# Patient Record
Sex: Female | Born: 1985 | Race: Black or African American | Hispanic: No | Marital: Single | Smoking: Never smoker
Health system: Southern US, Community
[De-identification: ages and names within clinical notes are randomized; demographics above are authoritative.]

## PROBLEM LIST (undated history)

## (undated) DIAGNOSIS — F32A Depression, unspecified: Secondary | ICD-10-CM

## (undated) DIAGNOSIS — K219 Gastro-esophageal reflux disease without esophagitis: Secondary | ICD-10-CM

## (undated) DIAGNOSIS — F410 Panic disorder [episodic paroxysmal anxiety] without agoraphobia: Secondary | ICD-10-CM

## (undated) DIAGNOSIS — R002 Palpitations: Secondary | ICD-10-CM

## (undated) DIAGNOSIS — R519 Headache, unspecified: Secondary | ICD-10-CM

## (undated) DIAGNOSIS — N83209 Unspecified ovarian cyst, unspecified side: Secondary | ICD-10-CM

## (undated) DIAGNOSIS — E079 Disorder of thyroid, unspecified: Secondary | ICD-10-CM

## (undated) DIAGNOSIS — E0590 Thyrotoxicosis, unspecified without thyrotoxic crisis or storm: Secondary | ICD-10-CM

## (undated) DIAGNOSIS — Z8719 Personal history of other diseases of the digestive system: Secondary | ICD-10-CM

## (undated) DIAGNOSIS — T8859XA Other complications of anesthesia, initial encounter: Secondary | ICD-10-CM

## (undated) HISTORY — PX: ESOPHAGOGASTRODUODENOSCOPY ENDOSCOPY: SHX5814

## (undated) HISTORY — PX: WISDOM TOOTH EXTRACTION: SHX21

## (undated) HISTORY — PX: TUBAL LIGATION: SHX77

---
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
## 2003-03-02 ENCOUNTER — Encounter: Admission: RE | Admit: 2003-03-02 | Discharge: 2003-03-02 | Payer: Self-pay | Admitting: Family Medicine

## 2003-03-23 ENCOUNTER — Other Ambulatory Visit: Admission: RE | Admit: 2003-03-23 | Discharge: 2003-03-23 | Payer: Self-pay | Admitting: Obstetrics and Gynecology

## 2003-09-13 ENCOUNTER — Other Ambulatory Visit: Admission: RE | Admit: 2003-09-13 | Discharge: 2003-09-13 | Payer: Self-pay | Admitting: Obstetrics and Gynecology

## 2004-01-31 ENCOUNTER — Other Ambulatory Visit: Admission: RE | Admit: 2004-01-31 | Discharge: 2004-01-31 | Payer: Self-pay | Admitting: Obstetrics and Gynecology

## 2004-03-13 ENCOUNTER — Emergency Department (HOSPITAL_COMMUNITY): Admission: EM | Admit: 2004-03-13 | Discharge: 2004-03-13 | Payer: Self-pay | Admitting: Emergency Medicine

## 2004-07-01 ENCOUNTER — Emergency Department (HOSPITAL_COMMUNITY): Admission: EM | Admit: 2004-07-01 | Discharge: 2004-07-01 | Payer: Self-pay | Admitting: Emergency Medicine

## 2004-08-03 ENCOUNTER — Ambulatory Visit (HOSPITAL_COMMUNITY): Admission: RE | Admit: 2004-08-03 | Discharge: 2004-08-03 | Payer: Self-pay | Admitting: Obstetrics and Gynecology

## 2004-08-04 ENCOUNTER — Inpatient Hospital Stay (HOSPITAL_COMMUNITY): Admission: AD | Admit: 2004-08-04 | Discharge: 2004-08-04 | Payer: Self-pay | Admitting: Obstetrics and Gynecology

## 2004-08-14 ENCOUNTER — Ambulatory Visit (HOSPITAL_COMMUNITY): Admission: RE | Admit: 2004-08-14 | Discharge: 2004-08-14 | Payer: Self-pay | Admitting: Obstetrics and Gynecology

## 2004-09-04 ENCOUNTER — Other Ambulatory Visit: Admission: RE | Admit: 2004-09-04 | Discharge: 2004-09-04 | Payer: Self-pay | Admitting: Obstetrics and Gynecology

## 2004-09-08 ENCOUNTER — Inpatient Hospital Stay (HOSPITAL_COMMUNITY): Admission: AD | Admit: 2004-09-08 | Discharge: 2004-09-09 | Payer: Self-pay | Admitting: Obstetrics and Gynecology

## 2004-09-12 ENCOUNTER — Inpatient Hospital Stay (HOSPITAL_COMMUNITY): Admission: AD | Admit: 2004-09-12 | Discharge: 2004-09-12 | Payer: Self-pay | Admitting: Obstetrics and Gynecology

## 2004-10-30 ENCOUNTER — Inpatient Hospital Stay (HOSPITAL_COMMUNITY): Admission: AD | Admit: 2004-10-30 | Discharge: 2004-10-30 | Payer: Self-pay | Admitting: Obstetrics and Gynecology

## 2004-11-10 ENCOUNTER — Inpatient Hospital Stay (HOSPITAL_COMMUNITY): Admission: AD | Admit: 2004-11-10 | Discharge: 2004-11-10 | Payer: Self-pay | Admitting: Obstetrics and Gynecology

## 2004-11-17 ENCOUNTER — Inpatient Hospital Stay (HOSPITAL_COMMUNITY): Admission: AD | Admit: 2004-11-17 | Discharge: 2004-11-17 | Payer: Self-pay | Admitting: Obstetrics and Gynecology

## 2004-12-15 ENCOUNTER — Inpatient Hospital Stay (HOSPITAL_COMMUNITY): Admission: AD | Admit: 2004-12-15 | Discharge: 2004-12-15 | Payer: Self-pay | Admitting: Obstetrics and Gynecology

## 2005-01-07 ENCOUNTER — Inpatient Hospital Stay (HOSPITAL_COMMUNITY): Admission: AD | Admit: 2005-01-07 | Discharge: 2005-01-07 | Payer: Self-pay | Admitting: Obstetrics and Gynecology

## 2005-01-24 ENCOUNTER — Inpatient Hospital Stay (HOSPITAL_COMMUNITY): Admission: AD | Admit: 2005-01-24 | Discharge: 2005-01-24 | Payer: Self-pay | Admitting: Obstetrics and Gynecology

## 2005-02-13 ENCOUNTER — Inpatient Hospital Stay (HOSPITAL_COMMUNITY): Admission: AD | Admit: 2005-02-13 | Discharge: 2005-02-13 | Payer: Self-pay | Admitting: Obstetrics and Gynecology

## 2005-03-22 ENCOUNTER — Inpatient Hospital Stay (HOSPITAL_COMMUNITY): Admission: AD | Admit: 2005-03-22 | Discharge: 2005-03-25 | Payer: Self-pay | Admitting: Obstetrics and Gynecology

## 2005-03-22 ENCOUNTER — Encounter (INDEPENDENT_AMBULATORY_CARE_PROVIDER_SITE_OTHER): Payer: Self-pay | Admitting: Specialist

## 2005-03-25 ENCOUNTER — Inpatient Hospital Stay (HOSPITAL_COMMUNITY): Admission: AD | Admit: 2005-03-25 | Discharge: 2005-03-26 | Payer: Self-pay | Admitting: Family Medicine

## 2005-04-11 ENCOUNTER — Inpatient Hospital Stay (HOSPITAL_COMMUNITY): Admission: AD | Admit: 2005-04-11 | Discharge: 2005-04-11 | Payer: Self-pay | Admitting: Obstetrics and Gynecology

## 2005-06-15 ENCOUNTER — Emergency Department (HOSPITAL_COMMUNITY): Admission: EM | Admit: 2005-06-15 | Discharge: 2005-06-15 | Payer: Self-pay | Admitting: Emergency Medicine

## 2005-06-16 ENCOUNTER — Emergency Department (HOSPITAL_COMMUNITY): Admission: EM | Admit: 2005-06-16 | Discharge: 2005-06-16 | Payer: Self-pay | Admitting: *Deleted

## 2005-07-16 ENCOUNTER — Emergency Department (HOSPITAL_COMMUNITY): Admission: EM | Admit: 2005-07-16 | Discharge: 2005-07-16 | Payer: Self-pay | Admitting: Emergency Medicine

## 2005-07-17 ENCOUNTER — Emergency Department (HOSPITAL_COMMUNITY): Admission: EM | Admit: 2005-07-17 | Discharge: 2005-07-17 | Payer: Self-pay | Admitting: Emergency Medicine

## 2005-08-08 ENCOUNTER — Other Ambulatory Visit: Admission: RE | Admit: 2005-08-08 | Discharge: 2005-08-08 | Payer: Self-pay | Admitting: Family Medicine

## 2005-10-22 ENCOUNTER — Emergency Department (HOSPITAL_COMMUNITY): Admission: EM | Admit: 2005-10-22 | Discharge: 2005-10-22 | Payer: Self-pay | Admitting: Emergency Medicine

## 2005-10-23 ENCOUNTER — Encounter: Admission: RE | Admit: 2005-10-23 | Discharge: 2005-10-23 | Payer: Self-pay | Admitting: Family Medicine

## 2005-11-03 ENCOUNTER — Ambulatory Visit (HOSPITAL_COMMUNITY): Admission: RE | Admit: 2005-11-03 | Discharge: 2005-11-03 | Payer: Self-pay | Admitting: Family Medicine

## 2006-01-03 ENCOUNTER — Emergency Department (HOSPITAL_COMMUNITY): Admission: EM | Admit: 2006-01-03 | Discharge: 2006-01-03 | Payer: Self-pay | Admitting: Emergency Medicine

## 2006-03-07 ENCOUNTER — Emergency Department (HOSPITAL_COMMUNITY): Admission: EM | Admit: 2006-03-07 | Discharge: 2006-03-07 | Payer: Self-pay | Admitting: Family Medicine

## 2006-03-17 ENCOUNTER — Encounter: Admission: RE | Admit: 2006-03-17 | Discharge: 2006-03-17 | Payer: Self-pay | Admitting: Surgery

## 2006-04-21 ENCOUNTER — Encounter: Admission: RE | Admit: 2006-04-21 | Discharge: 2006-04-21 | Payer: Self-pay | Admitting: Family Medicine

## 2006-05-19 ENCOUNTER — Emergency Department (HOSPITAL_COMMUNITY): Admission: EM | Admit: 2006-05-19 | Discharge: 2006-05-19 | Payer: Self-pay | Admitting: Family Medicine

## 2006-06-08 ENCOUNTER — Emergency Department (HOSPITAL_COMMUNITY): Admission: EM | Admit: 2006-06-08 | Discharge: 2006-06-08 | Payer: Self-pay | Admitting: Family Medicine

## 2006-09-16 ENCOUNTER — Emergency Department (HOSPITAL_COMMUNITY): Admission: EM | Admit: 2006-09-16 | Discharge: 2006-09-16 | Payer: Self-pay | Admitting: Emergency Medicine

## 2006-10-28 ENCOUNTER — Emergency Department (HOSPITAL_COMMUNITY): Admission: EM | Admit: 2006-10-28 | Discharge: 2006-10-28 | Payer: Self-pay | Admitting: Emergency Medicine

## 2006-10-31 ENCOUNTER — Emergency Department (HOSPITAL_COMMUNITY): Admission: EM | Admit: 2006-10-31 | Discharge: 2006-10-31 | Payer: Self-pay | Admitting: Emergency Medicine

## 2006-11-19 ENCOUNTER — Emergency Department (HOSPITAL_COMMUNITY): Admission: EM | Admit: 2006-11-19 | Discharge: 2006-11-19 | Payer: Self-pay | Admitting: Family Medicine

## 2007-03-31 ENCOUNTER — Encounter: Payer: Self-pay | Admitting: Internal Medicine

## 2007-05-07 ENCOUNTER — Ambulatory Visit: Payer: Self-pay | Admitting: Internal Medicine

## 2007-05-07 DIAGNOSIS — R634 Abnormal weight loss: Secondary | ICD-10-CM | POA: Insufficient documentation

## 2007-07-20 ENCOUNTER — Encounter: Payer: Self-pay | Admitting: Family Medicine

## 2007-08-06 ENCOUNTER — Emergency Department (HOSPITAL_COMMUNITY): Admission: EM | Admit: 2007-08-06 | Discharge: 2007-08-06 | Payer: Self-pay | Admitting: Emergency Medicine

## 2007-08-14 ENCOUNTER — Inpatient Hospital Stay (HOSPITAL_COMMUNITY): Admission: AD | Admit: 2007-08-14 | Discharge: 2007-08-14 | Payer: Self-pay | Admitting: Obstetrics

## 2007-08-21 ENCOUNTER — Inpatient Hospital Stay (HOSPITAL_COMMUNITY): Admission: AD | Admit: 2007-08-21 | Discharge: 2007-08-21 | Payer: Self-pay | Admitting: Obstetrics & Gynecology

## 2007-09-05 ENCOUNTER — Emergency Department (HOSPITAL_COMMUNITY): Admission: EM | Admit: 2007-09-05 | Discharge: 2007-09-05 | Payer: Self-pay | Admitting: Emergency Medicine

## 2007-11-09 ENCOUNTER — Ambulatory Visit (HOSPITAL_COMMUNITY): Admission: RE | Admit: 2007-11-09 | Discharge: 2007-11-09 | Payer: Self-pay | Admitting: Obstetrics

## 2007-11-30 ENCOUNTER — Inpatient Hospital Stay (HOSPITAL_COMMUNITY): Admission: AD | Admit: 2007-11-30 | Discharge: 2007-11-30 | Payer: Self-pay | Admitting: Obstetrics

## 2008-02-03 ENCOUNTER — Ambulatory Visit (HOSPITAL_COMMUNITY): Admission: RE | Admit: 2008-02-03 | Discharge: 2008-02-03 | Payer: Self-pay | Admitting: Obstetrics

## 2008-02-15 ENCOUNTER — Inpatient Hospital Stay (HOSPITAL_COMMUNITY): Admission: AD | Admit: 2008-02-15 | Discharge: 2008-02-15 | Payer: Self-pay | Admitting: Obstetrics

## 2008-03-09 ENCOUNTER — Inpatient Hospital Stay (HOSPITAL_COMMUNITY): Admission: AD | Admit: 2008-03-09 | Discharge: 2008-03-09 | Payer: Self-pay | Admitting: Obstetrics & Gynecology

## 2008-03-24 ENCOUNTER — Ambulatory Visit (HOSPITAL_COMMUNITY): Admission: RE | Admit: 2008-03-24 | Discharge: 2008-03-24 | Payer: Self-pay | Admitting: Obstetrics

## 2008-04-10 ENCOUNTER — Inpatient Hospital Stay (HOSPITAL_COMMUNITY): Admission: AD | Admit: 2008-04-10 | Discharge: 2008-04-10 | Payer: Self-pay | Admitting: Obstetrics & Gynecology

## 2008-04-12 ENCOUNTER — Encounter: Payer: Self-pay | Admitting: Obstetrics

## 2008-04-12 ENCOUNTER — Inpatient Hospital Stay (HOSPITAL_COMMUNITY): Admission: RE | Admit: 2008-04-12 | Discharge: 2008-04-15 | Payer: Self-pay | Admitting: Obstetrics

## 2008-04-23 ENCOUNTER — Inpatient Hospital Stay (HOSPITAL_COMMUNITY): Admission: AD | Admit: 2008-04-23 | Discharge: 2008-04-23 | Payer: Self-pay | Admitting: Obstetrics & Gynecology

## 2008-12-23 ENCOUNTER — Emergency Department (HOSPITAL_COMMUNITY): Admission: EM | Admit: 2008-12-23 | Discharge: 2008-12-23 | Payer: Self-pay | Admitting: Emergency Medicine

## 2009-02-03 ENCOUNTER — Encounter: Payer: Self-pay | Admitting: Internal Medicine

## 2009-02-04 ENCOUNTER — Emergency Department (HOSPITAL_COMMUNITY): Admission: EM | Admit: 2009-02-04 | Discharge: 2009-02-04 | Payer: Self-pay | Admitting: Family Medicine

## 2009-03-10 ENCOUNTER — Emergency Department (HOSPITAL_COMMUNITY): Admission: EM | Admit: 2009-03-10 | Discharge: 2009-03-11 | Payer: Self-pay | Admitting: Emergency Medicine

## 2009-03-29 ENCOUNTER — Encounter: Payer: Self-pay | Admitting: Internal Medicine

## 2009-03-30 ENCOUNTER — Encounter: Payer: Self-pay | Admitting: Internal Medicine

## 2009-04-24 ENCOUNTER — Emergency Department (HOSPITAL_COMMUNITY): Admission: EM | Admit: 2009-04-24 | Discharge: 2009-04-24 | Payer: Self-pay | Admitting: Emergency Medicine

## 2009-04-28 ENCOUNTER — Ambulatory Visit: Payer: Self-pay | Admitting: Internal Medicine

## 2009-04-28 DIAGNOSIS — F411 Generalized anxiety disorder: Secondary | ICD-10-CM | POA: Insufficient documentation

## 2009-04-28 DIAGNOSIS — E0590 Thyrotoxicosis, unspecified without thyrotoxic crisis or storm: Secondary | ICD-10-CM | POA: Insufficient documentation

## 2009-05-15 IMAGING — US US OB FOLLOW-UP
1 series · 14 of 23 positions shown · non-contrast
Comparison: none

OBSTETRICAL ULTRASOUND:
 This ultrasound exam was performed in the [HOSPITAL] Ultrasound Department.  The OB US report was generated in the AS system, and faxed to the ordering physician.  This report is also available in [REDACTED] PACS.

[Series 1: us ob follow-up · 0.28mm/px · 14 of 23 slices shown]
[im 1/23]
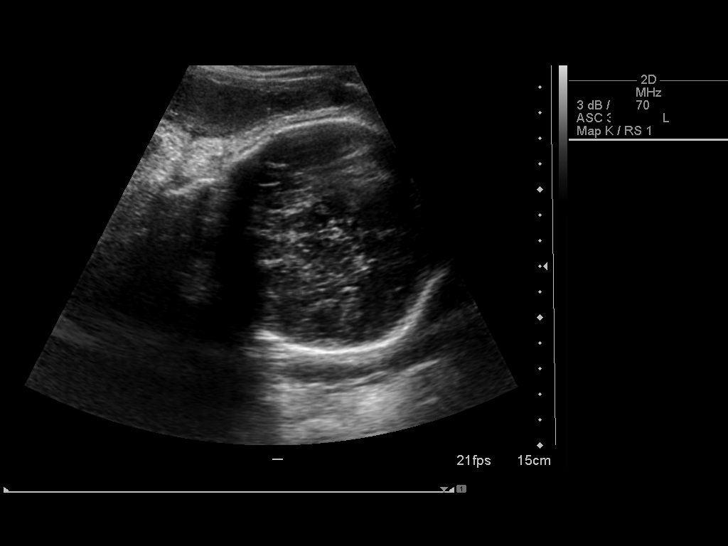
[im 3/23]
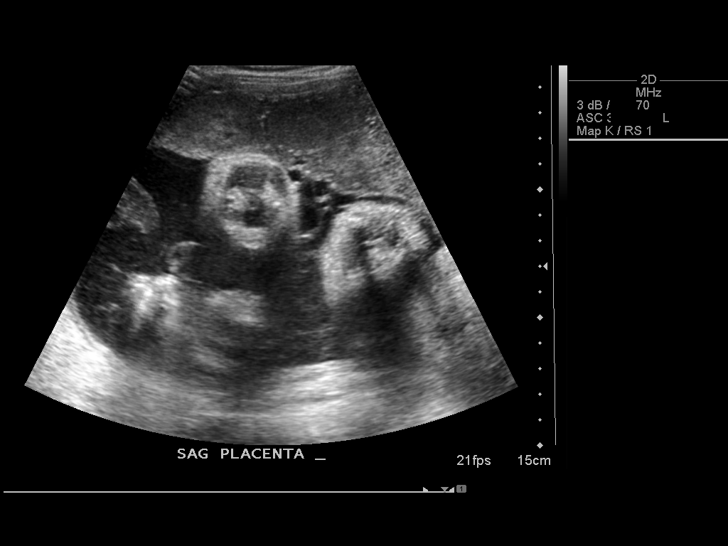
[im 5/23]
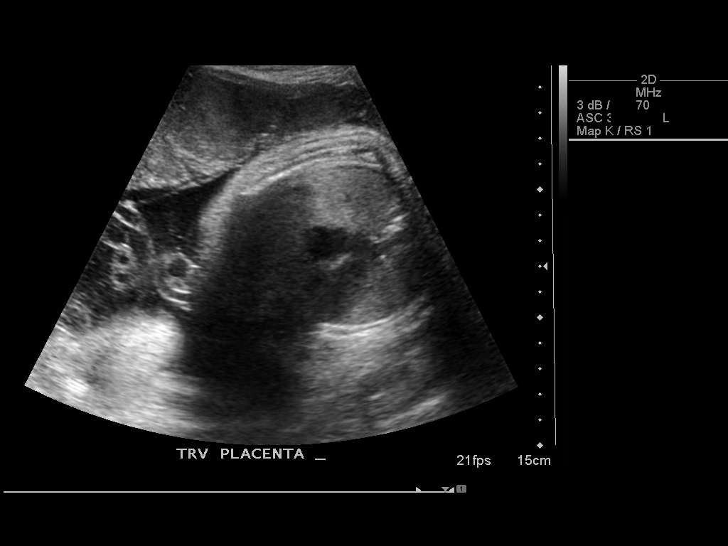
[im 6/23]
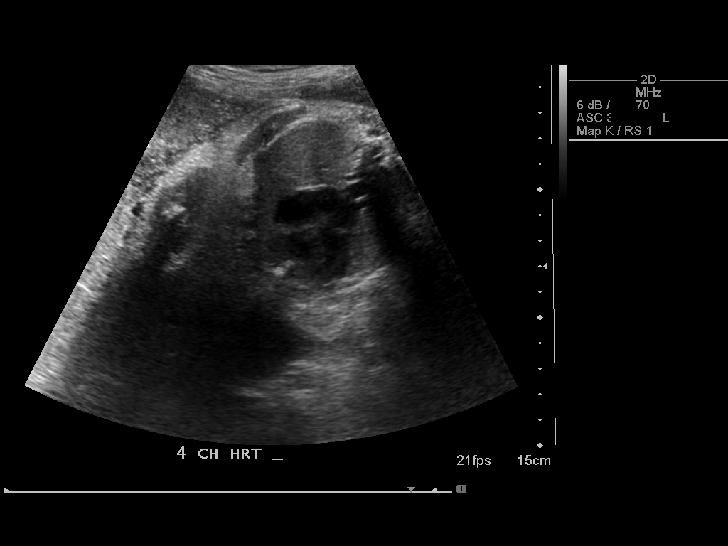
[im 8/23]
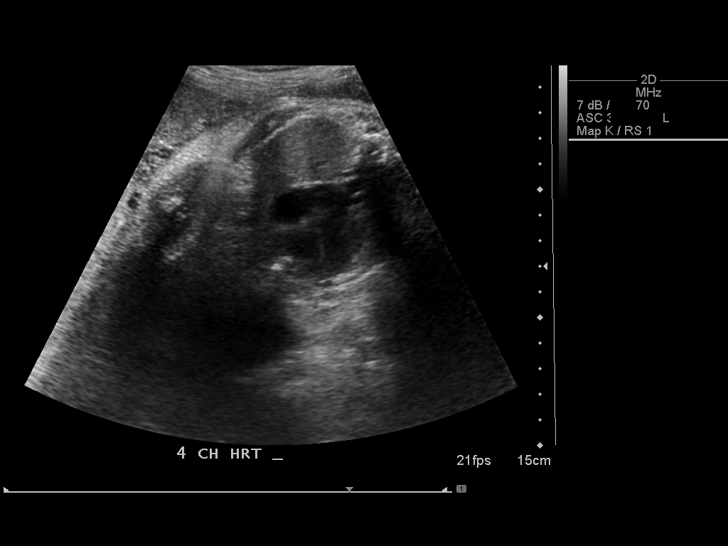
[im 10/23]
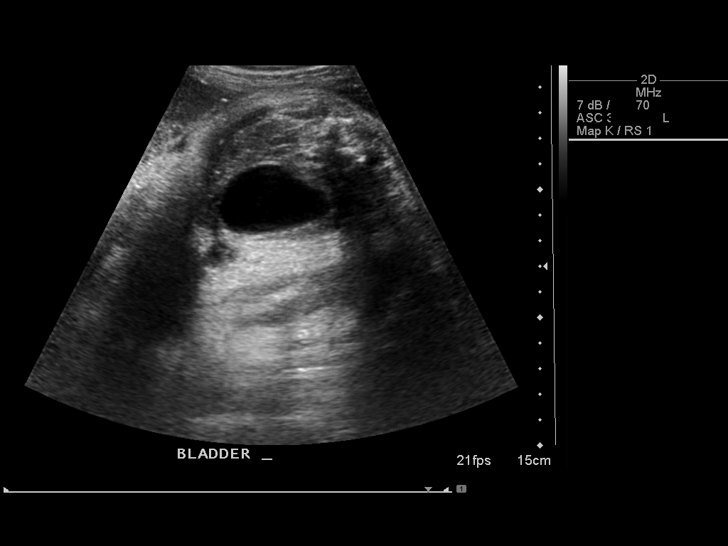
[im 11/23]
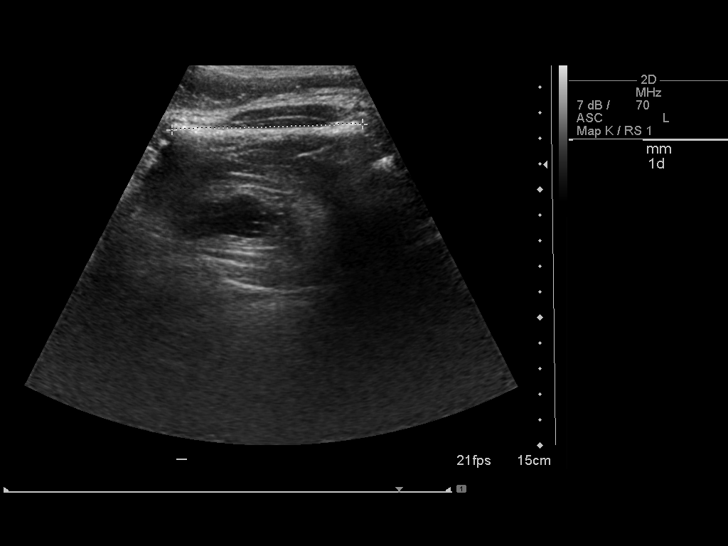
[im 13/23]
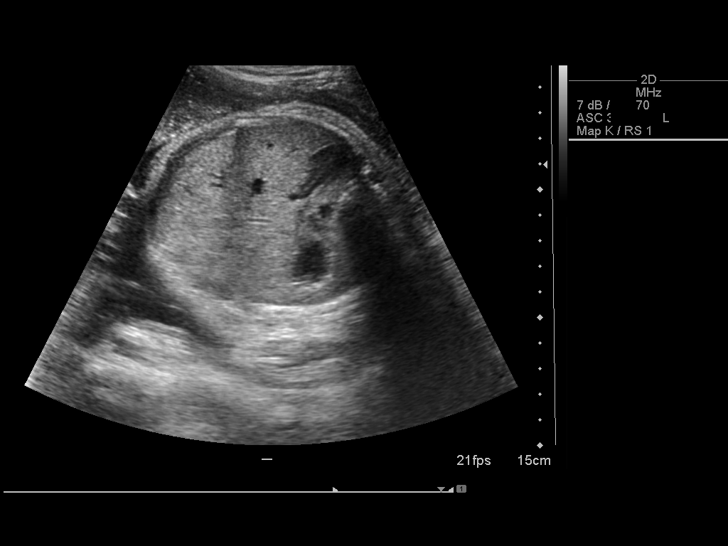
[im 14/23]
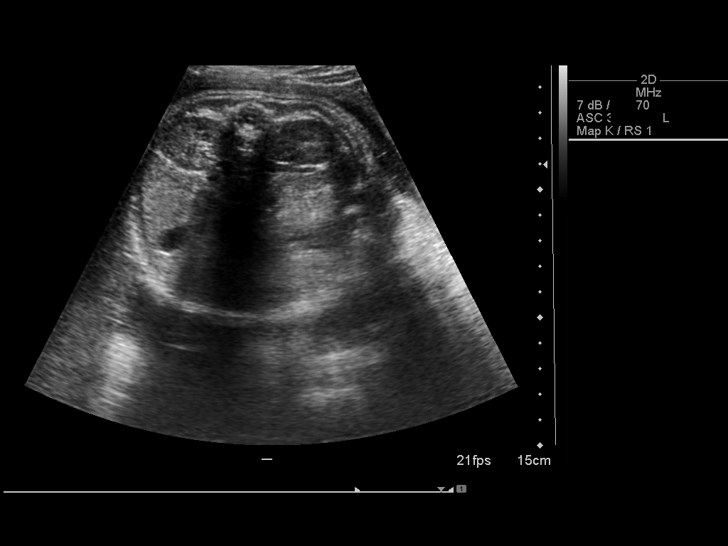
[im 16/23]
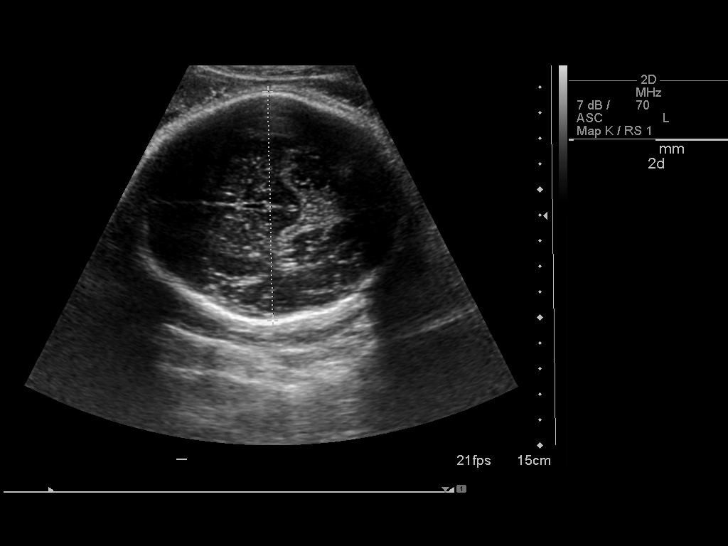
[im 18/23]
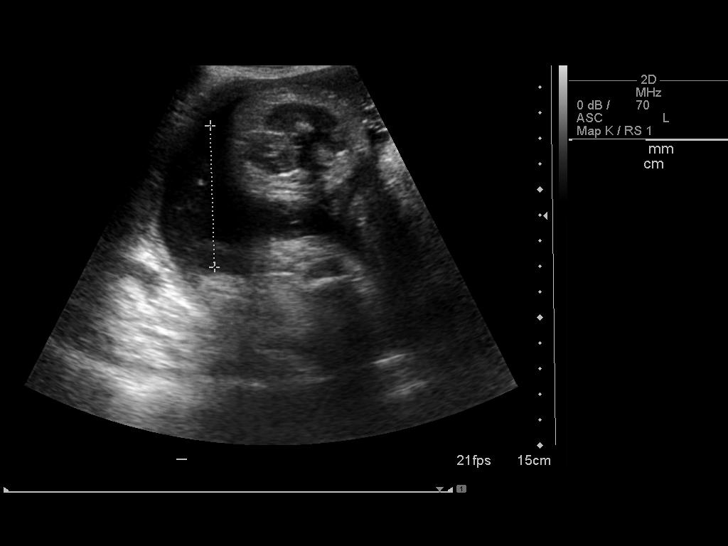
[im 19/23]
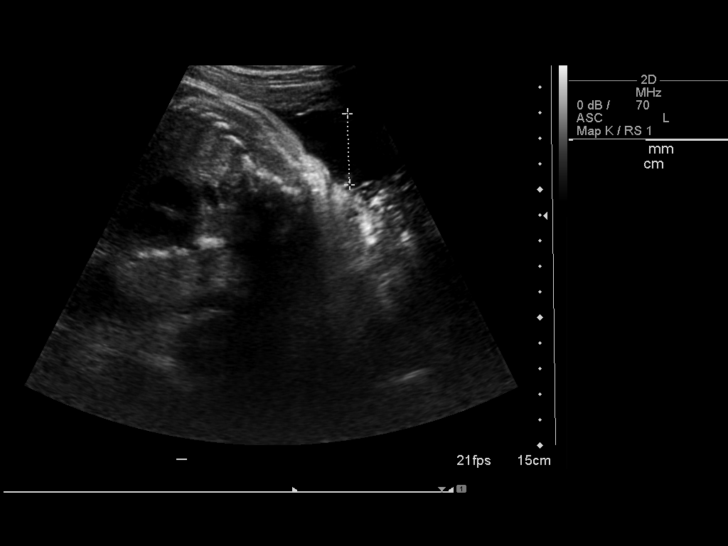
[im 21/23]
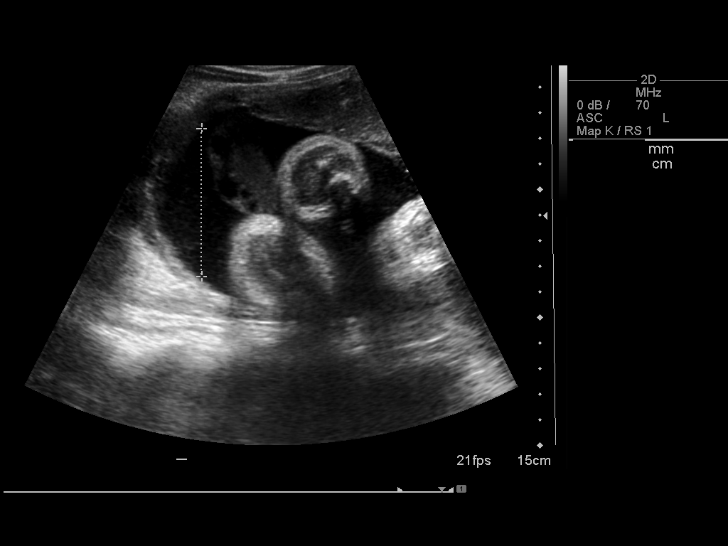
[im 23/23]
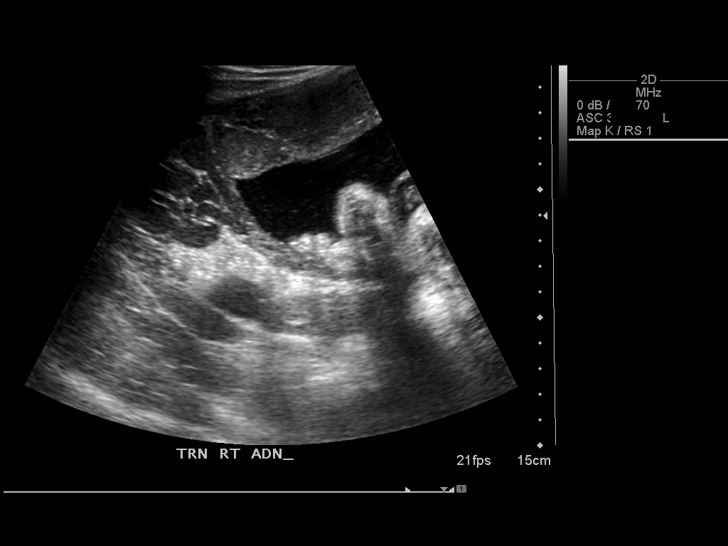

[14 of 23 positions shown; findings below may reference images not displayed]

IMPRESSION: See AS Obstetric US report.

## 2009-06-09 ENCOUNTER — Emergency Department (HOSPITAL_COMMUNITY): Admission: EM | Admit: 2009-06-09 | Discharge: 2009-06-09 | Payer: Self-pay | Admitting: Emergency Medicine

## 2009-06-14 IMAGING — CR DG CHEST 2V
2 series · 2 of 2 positions shown · non-contrast
Comparison: None

CLINICAL DATA: Hypertension and chest discomfort

CHEST - 2 VIEW

[view not recorded (1 of 2)]
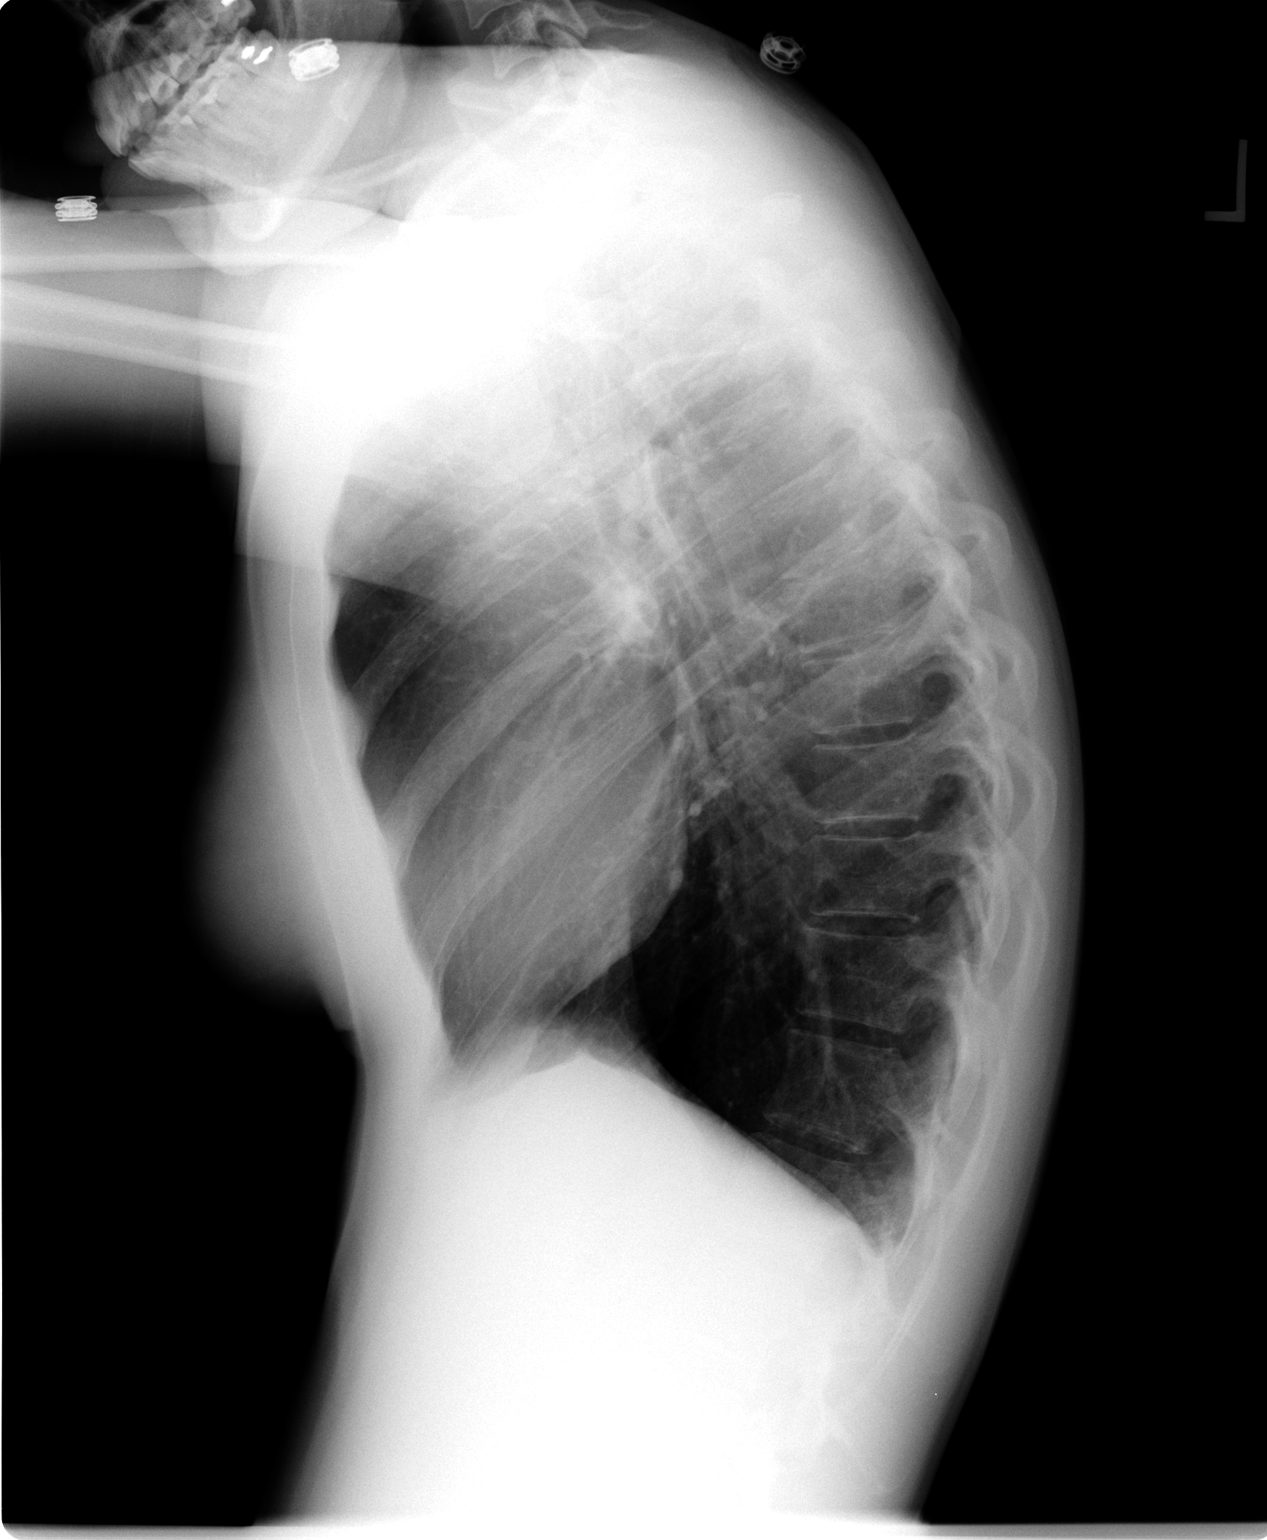

[view not recorded (2 of 2)]
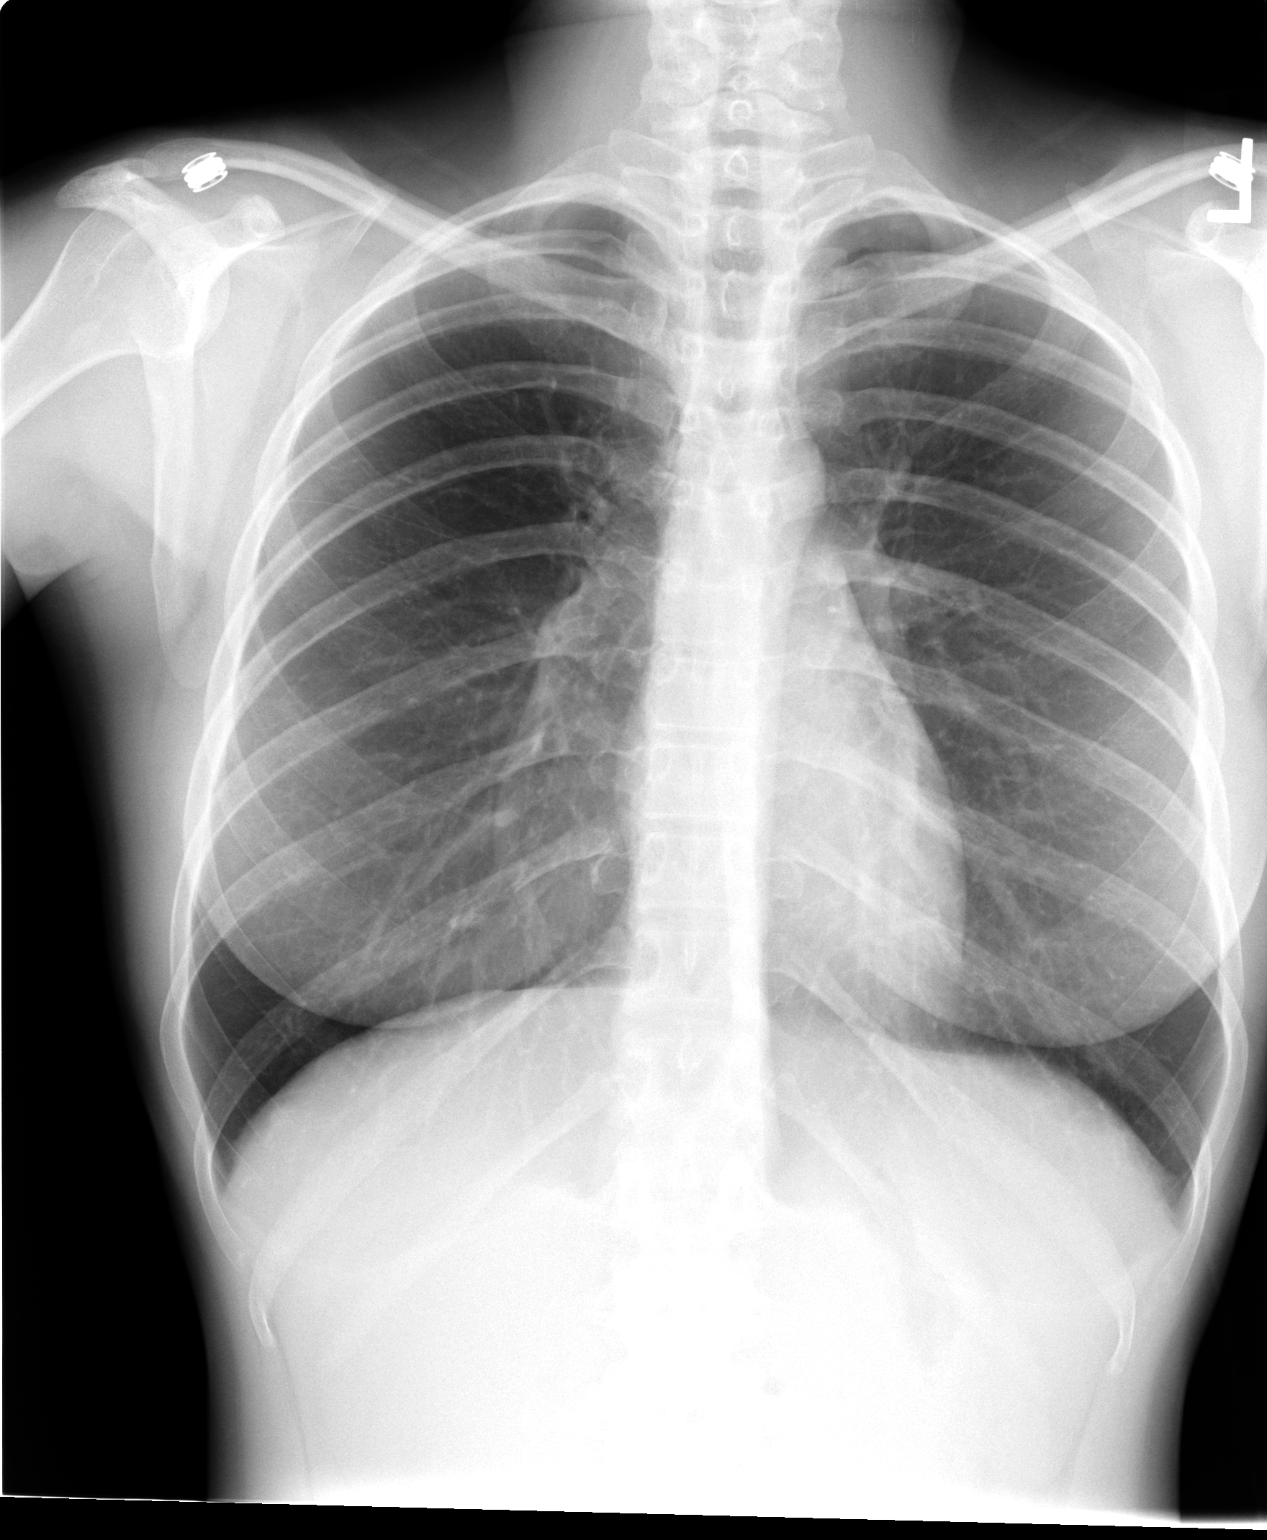

[2 of 2 positions shown; findings below may reference images not displayed]

FINDINGS: The heart and mediastinal contours are normal.  Pulmonary
vascularity is distinct.  Both lungs are well expanded and clear.
The patient is slightly kyphotic on the lateral view, likely due to
her positioning.  The vertebral bodies appear unremarkable.  No
acute bony abnormality.
IMPRESSION: No evidence of acute cardiopulmonary disease.

## 2009-08-24 ENCOUNTER — Ambulatory Visit: Payer: Self-pay | Admitting: Internal Medicine

## 2009-08-24 DIAGNOSIS — R002 Palpitations: Secondary | ICD-10-CM | POA: Insufficient documentation

## 2009-09-09 ENCOUNTER — Emergency Department (HOSPITAL_COMMUNITY): Admission: EM | Admit: 2009-09-09 | Discharge: 2009-09-09 | Payer: Self-pay | Admitting: Emergency Medicine

## 2009-09-25 ENCOUNTER — Emergency Department (HOSPITAL_COMMUNITY): Admission: EM | Admit: 2009-09-25 | Discharge: 2009-09-25 | Payer: Self-pay | Admitting: Emergency Medicine

## 2009-10-17 ENCOUNTER — Other Ambulatory Visit: Admission: RE | Admit: 2009-10-17 | Discharge: 2009-10-17 | Payer: Self-pay | Admitting: Obstetrics and Gynecology

## 2010-03-09 ENCOUNTER — Emergency Department (HOSPITAL_COMMUNITY)
Admission: EM | Admit: 2010-03-09 | Discharge: 2010-03-10 | Payer: Self-pay | Source: Home / Self Care | Admitting: Emergency Medicine

## 2010-03-12 ENCOUNTER — Encounter: Admission: RE | Admit: 2010-03-12 | Discharge: 2010-03-12 | Payer: Self-pay | Admitting: Family Medicine

## 2010-03-14 ENCOUNTER — Encounter
Admission: RE | Admit: 2010-03-14 | Discharge: 2010-03-14 | Payer: Self-pay | Source: Home / Self Care | Admitting: Family Medicine

## 2010-03-14 ENCOUNTER — Other Ambulatory Visit
Admission: RE | Admit: 2010-03-14 | Discharge: 2010-03-14 | Payer: Self-pay | Source: Home / Self Care | Admitting: Interventional Radiology

## 2010-04-29 ENCOUNTER — Encounter: Payer: Self-pay | Admitting: Obstetrics & Gynecology

## 2010-05-08 NOTE — Consult Note (Signed)
Summary: [facility] Referral Form  [facility] Referral Form   Imported By: Roderic Ovens 05/09/2009 12:40:40  _____________________________________________________________________  External Attachment:    Type:   Image     Comment:   External Document

## 2010-05-08 NOTE — Assessment & Plan Note (Signed)
Summary: ep/svt/jss   Referring Provider:  Paulino Rily  CC:  ep/tachycardia.  Marland Kitchen  History of Present Illness: Mrs. Rebecca Edwards is seen at the request of Dr. Paulino Rily 4 palpitations. 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 Apparently she has had recurrence following her most recent childbirth about a year ago. Following the making of this appointment her thyroid status came back as abnormal again the patient decided to calm anyway.  She finds her palpitations which she describes as irregularities occur in response to caffeine and over-the-counter stimulants as well as stress of which there has been a great deal in her life. She has been treated with a beta blocker at very low dose (metoprolol 12.5) which she was unable to tolerate. She is being given a protrusion for diastolic which he has not filled.  Cardiac evaluation included an ultrasound in December 2008 the report of which we were able to obtain and review. This is normal recorder report is pending at this time  Current Medications (verified): 1)  Klonopin 0.5 Mg Tabs (Clonazepam) .... Take One Tablet As Needed 2)  Multivitamins  Tabs (Multiple Vitamin) .... Take One Tablet Once Daily 3)  Vitamin C 500 Mg Tabs (Ascorbic Acid) .... Once Daily 4)  Complex B-100  Cr-Tabs (B Complex-Biotin-Fa) .... 1/2 Tablet Daily 5)  Fish Oil 1000 Mg Caps (Omega-3 Fatty Acids) .... Take Capsule Once Daily 6)  Ibuprofen 400 Mg Tabs (Ibuprofen) .... As Needed  Allergies (verified): 1)  ! Vicodin 2)  ! Darvocet 3)  ! Sudafed 4)  ! Amoxicillin  Past History:  Past Medical History: Last updated: 04/28/2009 Hyperthyroidism Palpitations Positive for hypertension and diabetes Migraine Headaches asthma allergic rhinitis Dysthymic disorder/anxiety IBS  Past Surgical History: Last updated: 04/27/2009 caesarean section Wisdom teeth 2003  Social History: Last updated: 04/28/2009 Tobacco Use - No.  Alcohol  Use - no Drug Use - no Single 2 children  Social History: Tobacco Use - No.  Alcohol Use - no Drug Use - no Single 2 children  Review of Systems       full review of systems was negative apart from a history of present illness and past medical history.   Vital Signs:  Patient profile:   25 year old female Height:      61 inches Weight:      94 pounds BMI:     17.83 Pulse rate:   77 / minute Pulse (ortho):   102 / minute Pulse rhythm:   regular BP sitting:   102 / 82  (left arm) BP standing:   120 / 83 Cuff size:   regular  Vitals Entered By: Judithe Modest CMA (April 28, 2009 10:05 AM)  Serial Vital Signs/Assessments:  Time      Position  BP       Pulse  Resp  Temp     By 10:38 AM  Lying RA  116/77   92                    Amanda Trulove CMA 10:38 AM  Sitting   121/74   85                    Amanda Trulove CMA 10:38 AM  Standing  120/83   102                   Amanda Trulove CMA  Comments: 10:38 AM 2 minutes-125/84 HR 112 3  minutes-126/88 HR 112 Pt reports mild dizziness and slight chest discomfort upon 2 minute measurement. By: Judithe Modest CMA    Physical Exam  General:  Alert and oriented in no acute distress. HEENT  normal . Neck veins were flat; carotids brisk and full without bruits. No lymphadenopathy. Back without kyphosis. Lungs clear. Heart sounds regular without murmurs or gallops. PMI nondisplaced. Abdomen soft with active bowel sounds without midline pulsation or hepatomegaly. Femoral pulses and distal pulses intact. Extremities were without clubbing cyanosis or edemaSkin warm and dry. Neurological exam grossly normal    EKG  Procedure date:  04/28/2009  Findings:      Sinus rhtyhm at 77 13/06/36 normal  Impression & Recommendations:  Problem # 1:  TACHYCARDIA (ICD-785.0) It is not clear whether this is now related to weight loss or hyperthyroidism  will await the resolution of theh thyorid  discussion Orders: EKG w/ Interpretation  (93000)  Problem # 2:  ANXIETY (ICD-300.00) at this point it is impossible to determine POTS  i dont think this represents SVT but the clarification will come with her recorder.

## 2010-05-08 NOTE — Letter (Signed)
Summary: [facility] Progress Note  [facility] Progress Note   Imported By: Roderic Ovens 05/09/2009 12:34:59  _____________________________________________________________________  External Attachment:    Type:   Image     Comment:   External Document

## 2010-05-08 NOTE — Assessment & Plan Note (Signed)
Summary: rov   Referring Provider:  Paulino Rily  CC:  rov/ Pt feeling well.  She has had elevated heart rate in the past but believes her thyroid to be the problem.  History of Present Illness: Ms. Rebecca Edwards is seen in followup for tachypalpitations. She has a history of hyperthyroidism which is now resolved. This is initially postpartum. She then had persistent palpitations through the spring of 2011. These occurred in the context of significant stress, recurrent illnesses and she carries a history of a panic disorder.  Her symptoms have largely abated. She occasionally has palpitations. They resolved without her Lane Hacker been aware.  Current Medications (verified): 1)  Klonopin 0.5 Mg Tabs (Clonazepam) .... Take One Tablet As Needed 2)  Multivitamins  Tabs (Multiple Vitamin) .... Take One Tablet Once Daily 3)  Complex B-100  Cr-Tabs (B Complex-Biotin-Fa) .... 1/2 Tablet Daily 4)  Ibuprofen 400 Mg Tabs (Ibuprofen) .... As Needed  Allergies (verified): 1)  ! Vicodin 2)  ! Darvocet 3)  ! Sudafed 4)  ! Amoxicillin  Past History:  Past Medical History: Last updated: 04/28/2009 Hyperthyroidism Palpitations Positive for hypertension and diabetes Migraine Headaches asthma allergic rhinitis Dysthymic disorder/anxiety IBS  Past Surgical History: Last updated: 04/27/2009 caesarean section Wisdom teeth 2003  Social History: Last updated: 04/28/2009 Tobacco Use - No.  Alcohol Use - no Drug Use - no Single 2 children  Vital Signs:  Patient profile:   25 year old female Height:      61 inches Weight:      93 pounds BMI:     17.64 Pulse rate:   76 / minute Pulse rhythm:   regular BP sitting:   118 / 76  (right arm) Cuff size:   regular  Vitals Entered By: Judithe Modest CMA (Aug 24, 2009 4:22 PM)  Physical Exam  General:  The patient was alert and oriented in no acute distress. HEENT Normal.  Neck veins were flat, carotids were brisk.  Lungs were clear.  Heart sounds  were regular without murmurs or gallops.  Abdomen was soft with active bowel sounds. There is no clubbing cyanosis or edema. Skin Warm and dry    Impression & Recommendations:  Problem # 1:  PALPITATIONS (ICD-785.1) these likely represent anxiety/dysautonomia. She is quite asymptomatic she will follow up with [place] as needed and Dr. Paulino Rily is

## 2010-06-24 LAB — URINE CULTURE
Colony Count: NO GROWTH
Culture: NO GROWTH

## 2010-06-24 LAB — POCT I-STAT, CHEM 8
BUN: 5 mg/dL — ABNORMAL LOW (ref 6–23)
BUN: 6 mg/dL (ref 6–23)
Calcium, Ion: 1.18 mmol/L (ref 1.12–1.32)
Calcium, Ion: 1.11 mmol/L — ABNORMAL LOW (ref 1.12–1.32)
Chloride: 105 mEq/L (ref 96–112)
Chloride: 107 mEq/L (ref 96–112)
Creatinine, Ser: 0.9 mg/dL (ref 0.4–1.2)
Creatinine, Ser: 0.9 mg/dL (ref 0.4–1.2)
Glucose, Bld: 103 mg/dL — ABNORMAL HIGH (ref 70–99)
Glucose, Bld: 91 mg/dL (ref 70–99)
HCT: 40 % (ref 36.0–46.0)
HCT: 37 % (ref 36.0–46.0)
Hemoglobin: 13.6 g/dL (ref 12.0–15.0)
Hemoglobin: 12.6 g/dL (ref 12.0–15.0)
Potassium: 3.2 mEq/L — ABNORMAL LOW (ref 3.5–5.1)
Potassium: 3.9 mEq/L (ref 3.5–5.1)
Sodium: 139 mEq/L (ref 135–145)
Sodium: 141 mEq/L (ref 135–145)
TCO2: 24 mmol/L (ref 0–100)
TCO2: 22 mmol/L (ref 0–100)

## 2010-06-24 LAB — CBC
HCT: 39.7 % (ref 36.0–46.0)
Hemoglobin: 13.6 g/dL (ref 12.0–15.0)
MCHC: 34.3 g/dL (ref 30.0–36.0)
MCV: 89.3 fL (ref 78.0–100.0)
Platelets: 220 10*3/uL (ref 150–400)
RBC: 4.44 MIL/uL (ref 3.87–5.11)
RDW: 12.9 % (ref 11.5–15.5)
WBC: 7.9 10*3/uL (ref 4.0–10.5)

## 2010-06-24 LAB — DIFFERENTIAL
Basophils Absolute: 0 10*3/uL (ref 0.0–0.1)
Basophils Relative: 1 % (ref 0–1)
Eosinophils Absolute: 0 10*3/uL (ref 0.0–0.7)
Eosinophils Relative: 0 % (ref 0–5)
Lymphocytes Relative: 13 % (ref 12–46)
Lymphs Abs: 1 10*3/uL (ref 0.7–4.0)
Monocytes Absolute: 0.4 10*3/uL (ref 0.1–1.0)
Monocytes Relative: 5 % (ref 3–12)
Neutro Abs: 6.5 10*3/uL (ref 1.7–7.7)
Neutrophils Relative %: 82 % — ABNORMAL HIGH (ref 43–77)

## 2010-06-24 LAB — WET PREP, GENITAL
Clue Cells Wet Prep HPF POC: NONE SEEN
Trich, Wet Prep: NONE SEEN
Yeast Wet Prep HPF POC: NONE SEEN

## 2010-06-24 LAB — URINE MICROSCOPIC-ADD ON

## 2010-06-24 LAB — GC/CHLAMYDIA PROBE AMP, GENITAL
Chlamydia, DNA Probe: NEGATIVE
GC Probe Amp, Genital: NEGATIVE

## 2010-06-24 LAB — URINALYSIS, ROUTINE W REFLEX MICROSCOPIC
Bilirubin Urine: NEGATIVE
Glucose, UA: NEGATIVE mg/dL
Hgb urine dipstick: NEGATIVE
Nitrite: NEGATIVE
Protein, ur: NEGATIVE mg/dL
Specific Gravity, Urine: 1.027 (ref 1.005–1.030)
Urobilinogen, UA: 0.2 mg/dL (ref 0.0–1.0)
pH: 6 (ref 5.0–8.0)

## 2010-06-24 LAB — PREGNANCY, URINE: Preg Test, Ur: NEGATIVE

## 2010-06-25 LAB — WET PREP, GENITAL
Clue Cells Wet Prep HPF POC: NONE SEEN
Trich, Wet Prep: NONE SEEN
Yeast Wet Prep HPF POC: NONE SEEN

## 2010-06-25 LAB — URINALYSIS, ROUTINE W REFLEX MICROSCOPIC
Bilirubin Urine: NEGATIVE
Glucose, UA: NEGATIVE mg/dL
Hgb urine dipstick: NEGATIVE
Ketones, ur: NEGATIVE mg/dL
Nitrite: NEGATIVE
Protein, ur: NEGATIVE mg/dL
Specific Gravity, Urine: 1.014 (ref 1.005–1.030)
Urobilinogen, UA: 0.2 mg/dL (ref 0.0–1.0)
pH: 6.5 (ref 5.0–8.0)

## 2010-06-25 LAB — GC/CHLAMYDIA PROBE AMP, GENITAL
Chlamydia, DNA Probe: NEGATIVE
GC Probe Amp, Genital: NEGATIVE

## 2010-06-25 LAB — POCT PREGNANCY, URINE: Preg Test, Ur: NEGATIVE

## 2010-06-25 LAB — RPR: RPR Ser Ql: NONREACTIVE

## 2010-07-10 LAB — BASIC METABOLIC PANEL
BUN: 9 mg/dL (ref 6–23)
CO2: 22 mEq/L (ref 19–32)
Calcium: 8.6 mg/dL (ref 8.4–10.5)
Chloride: 100 mEq/L (ref 96–112)
Creatinine, Ser: 0.72 mg/dL (ref 0.4–1.2)
GFR calc Af Amer: >60 mL/min (ref >60)
GFR calc non Af Amer: >60 mL/min (ref >60)
Glucose, Bld: 96 mg/dL (ref 70–99)
Potassium: 3 mEq/L — ABNORMAL LOW (ref 3.5–5.1)
Sodium: 135 mEq/L (ref 135–145)

## 2010-07-10 LAB — URINALYSIS, ROUTINE W REFLEX MICROSCOPIC
Bilirubin Urine: NEGATIVE
Glucose, UA: NEGATIVE mg/dL
Ketones, ur: 15 mg/dL — AB
Leukocytes, UA: NEGATIVE
Nitrite: NEGATIVE
Protein, ur: NEGATIVE mg/dL
Specific Gravity, Urine: 1.017 (ref 1.005–1.030)
Urobilinogen, UA: 0.2 mg/dL (ref 0.0–1.0)
pH: 6 (ref 5.0–8.0)

## 2010-07-10 LAB — CBC
HCT: 41.4 % (ref 36.0–46.0)
Hemoglobin: 14.1 g/dL (ref 12.0–15.0)
MCHC: 34.1 g/dL (ref 30.0–36.0)
MCV: 92 fL (ref 78.0–100.0)
Platelets: 271 10*3/uL (ref 150–400)
RBC: 4.5 MIL/uL (ref 3.87–5.11)
RDW: 12.1 % (ref 11.5–15.5)
WBC: 8.6 10*3/uL (ref 4.0–10.5)

## 2010-07-10 LAB — DIFFERENTIAL
Basophils Absolute: 0.1 10*3/uL (ref 0.0–0.1)
Basophils Relative: 1 % (ref 0–1)
Eosinophils Absolute: 0 10*3/uL (ref 0.0–0.7)
Eosinophils Relative: 0 % (ref 0–5)
Lymphocytes Relative: 25 % (ref 12–46)
Lymphs Abs: 2.1 10*3/uL (ref 0.7–4.0)
Monocytes Absolute: 0.6 10*3/uL (ref 0.1–1.0)
Monocytes Relative: 6 % (ref 3–12)
Neutro Abs: 5.8 10*3/uL (ref 1.7–7.7)
Neutrophils Relative %: 68 % (ref 43–77)

## 2010-07-10 LAB — POCT PREGNANCY, URINE: Preg Test, Ur: NEGATIVE

## 2010-07-10 LAB — RAPID URINE DRUG SCREEN, HOSP PERFORMED
Amphetamines: NOT DETECTED
Barbiturates: NOT DETECTED
Benzodiazepines: NOT DETECTED
Cocaine: NOT DETECTED
Opiates: NOT DETECTED
Tetrahydrocannabinol: NOT DETECTED

## 2010-07-10 LAB — URINE MICROSCOPIC-ADD ON

## 2010-07-12 LAB — POCT I-STAT, CHEM 8
BUN: 7 mg/dL (ref 6–23)
Calcium, Ion: 1.11 mmol/L — ABNORMAL LOW (ref 1.12–1.32)
Chloride: 106 mEq/L (ref 96–112)
Creatinine, Ser: 0.8 mg/dL (ref 0.4–1.2)
Glucose, Bld: 97 mg/dL (ref 70–99)
HCT: 40 % (ref 36.0–46.0)
Hemoglobin: 13.6 g/dL (ref 12.0–15.0)
Potassium: 3.6 mEq/L (ref 3.5–5.1)
Sodium: 140 mEq/L (ref 135–145)
TCO2: 21 mmol/L (ref 0–100)

## 2010-07-12 LAB — POCT RAPID STREP A (OFFICE): Streptococcus, Group A Screen (Direct): NEGATIVE

## 2010-07-12 LAB — POCT INFECTIOUS MONO SCREEN: Mono Screen: NEGATIVE

## 2010-07-23 LAB — CBC
HCT: 33.8 % — ABNORMAL LOW (ref 36.0–46.0)
HCT: 36.2 % (ref 36.0–46.0)
HCT: 42.7 % (ref 36.0–46.0)
Hemoglobin: 11.7 g/dL — ABNORMAL LOW (ref 12.0–15.0)
Hemoglobin: 12.6 g/dL (ref 12.0–15.0)
Hemoglobin: 14.6 g/dL (ref 12.0–15.0)
MCHC: 34.5 g/dL (ref 30.0–36.0)
MCHC: 34.7 g/dL (ref 30.0–36.0)
MCHC: 34.2 g/dL (ref 30.0–36.0)
MCV: 92.1 fL (ref 78.0–100.0)
MCV: 92.7 fL (ref 78.0–100.0)
MCV: 92.7 fL (ref 78.0–100.0)
Platelets: 187 10*3/uL (ref 150–400)
Platelets: 211 10*3/uL (ref 150–400)
Platelets: 368 10*3/uL (ref 150–400)
RBC: 3.64 MIL/uL — ABNORMAL LOW (ref 3.87–5.11)
RBC: 3.92 MIL/uL (ref 3.87–5.11)
RBC: 4.6 MIL/uL (ref 3.87–5.11)
RDW: 14.1 % (ref 11.5–15.5)
RDW: 14.3 % (ref 11.5–15.5)
RDW: 13.3 % (ref 11.5–15.5)
WBC: 12 10*3/uL — ABNORMAL HIGH (ref 4.0–10.5)
WBC: 12.2 10*3/uL — ABNORMAL HIGH (ref 4.0–10.5)
WBC: 10.3 10*3/uL (ref 4.0–10.5)

## 2010-07-23 LAB — RH IMMUNE GLOB WKUP(>/=20WKS)(NOT WOMEN'S HOSP): Fetal Screen: NEGATIVE

## 2010-07-23 LAB — COMPREHENSIVE METABOLIC PANEL
ALT: 35 U/L (ref 0–35)
AST: 25 U/L (ref 0–37)
Albumin: 3.5 g/dL (ref 3.5–5.2)
Alkaline Phosphatase: 109 U/L (ref 39–117)
BUN: 11 mg/dL (ref 6–23)
CO2: 24 mEq/L (ref 19–32)
Calcium: 8.9 mg/dL (ref 8.4–10.5)
Chloride: 108 mEq/L (ref 96–112)
Creatinine, Ser: 0.75 mg/dL (ref 0.4–1.2)
GFR calc Af Amer: >60 mL/min (ref >60)
GFR calc non Af Amer: >60 mL/min (ref >60)
Glucose, Bld: 101 mg/dL — ABNORMAL HIGH (ref 70–99)
Potassium: 3.4 mEq/L — ABNORMAL LOW (ref 3.5–5.1)
Sodium: 139 mEq/L (ref 135–145)
Total Bilirubin: 0.4 mg/dL (ref 0.3–1.2)
Total Protein: 6.9 g/dL (ref 6.0–8.3)

## 2010-07-23 LAB — DIFFERENTIAL
Basophils Absolute: 0 10*3/uL (ref 0.0–0.1)
Basophils Relative: 0 % (ref 0–1)
Eosinophils Absolute: 0 10*3/uL (ref 0.0–0.7)
Eosinophils Relative: 0 % (ref 0–5)
Lymphocytes Relative: 15 % (ref 12–46)
Lymphs Abs: 1.8 10*3/uL (ref 0.7–4.0)
Monocytes Absolute: 0.6 10*3/uL (ref 0.1–1.0)
Monocytes Relative: 5 % (ref 3–12)
Neutro Abs: 9.5 10*3/uL — ABNORMAL HIGH (ref 1.7–7.7)
Neutrophils Relative %: 79 % — ABNORMAL HIGH (ref 43–77)

## 2010-07-23 LAB — RPR: RPR Ser Ql: NONREACTIVE

## 2010-07-23 LAB — URIC ACID: Uric Acid, Serum: 4.6 mg/dL (ref 2.4–7.0)

## 2010-07-23 LAB — LACTATE DEHYDROGENASE: LDH: 207 U/L (ref 94–250)

## 2010-08-21 NOTE — Discharge Summary (Signed)
NAMEADALAE, BAYSINGER             ACCOUNT NO.:  [id number]   MEDICAL RECORD NO.:  [id number]          PATIENT TYPE:  INP   LOCATION:  9127                          FACILITY:  [facility]   PHYSICIAN:  Charles A. Clearance Coots, M.D.DATE OF BIRTH:  May 07, 1985   DATE OF ADMISSION:  04/12/2008  DATE OF DISCHARGE:  04/15/2008                               DISCHARGE SUMMARY   ADMITTING DIAGNOSES:  A 40 plus weeks' gestation, previous cesarean  section, desires permanent sterilization, desires repeat cesarean  section.   DISCHARGE DIAGNOSES:  A 40 plus weeks' gestation, previous cesarean  section, desires permanent sterilization, desires repeat cesarean  section.  Status post repeat low transverse cesarean section and  bilateral tubal ligation.  Viable female infant was delivered by repeat  low transverse cesarean section on April 12, 2008, at 12:42.  Apgars  were 9 at 1 minute and 9 at 5 minutes, weight 2860 g, and length 48.26  cm.  Mother and infant discharged home in good condition.   REASON FOR ADMISSION:  A 25 year old G2, P58, estimated date of  confinement on April 07, 2008.  History of previous cesarean section,  desired repeat cesarean section, and desired permanent sterilization.   PAST SURGICAL HISTORY:  Cesarean section.   ILLNESSES:  Depression, anxiety, and headaches.   MEDICATIONS:  Prenatal vitamins.   ALLERGIES:  DARVOCET.   SOCIAL HISTORY:  Single.  Negative tobacco, alcohol, or recreational  drug use.   FAMILY HISTORY:  Positive for hypertension and diabetes.   PHYSICAL EXAMINATION:  GENERAL:  Slim black female in no acute distress  and afebrile.  VITAL SIGNS:  Stable.  LUNGS:  Clear to auscultation bilaterally.  HEART:  Regular rate and rhythm.  ABDOMEN:  Gravid and nontender.  Cervix long, closed, vertex at -1  station.   ADMITTING LABORATORY DATA:  Hemoglobin 12.6, hematocrit 36.2, white  blood cell count 12,000, and platelets 211,000.  RPR was nonreactive.   HOSPITAL COURSE:  The patient underwent a repeat low transverse cesarean  section and bilateral tubal ligation on April 12, 2008.  There were no  intraoperative complications.  Postoperative course was uncomplicated.  The patient was discharged home on postop day #3, in good condition.   DISCHARGE LABORATORY DATA:  Hemoglobin 11.7, hematocrit 33.8, white  blood cell count 12,200, and platelets 187,000.   DISCHARGE DISPOSITION:  Medications, ibuprofen and Percocet were  prescribed for pain.  Continue prenatal vitamins.  Routine written  instructions were given for discharge after cesarean section.  The  patient is to call the office for followup appointment in 2 weeks.      Charles A. Clearance Coots, M.D.  Electronically Signed     CAH/MEDQ  D:  04/15/2008  T:  04/16/2008  Job:  [id number]

## 2010-08-21 NOTE — Op Note (Signed)
Rebecca Edwards, Rebecca Edwards             ACCOUNT NO.:  [id number]   MEDICAL RECORD NO.:  [id number]          PATIENT TYPE:  INP   LOCATION:  9127                          FACILITY:  [facility]   PHYSICIAN:  Charles A. Clearance Coots, M.D.DATE OF BIRTH:  03/22/1986   DATE OF PROCEDURE:  04/12/2008  DATE OF DISCHARGE:                               OPERATIVE REPORT   PREOPERATIVE DIAGNOSES:  1. A 40 weeks' gestation.  2. Previous cesarean section.  3. Desired repeat cesarean section.  4. Desired permanent sterilization.   POSTOPERATIVE DIAGNOSES:  1. A 40 weeks' gestation.  2. Previous cesarean section.  3. Desired repeat cesarean section.  4. Desired permanent sterilization.   PROCEDURES:  1. Repeat low transverse cesarean section.  2. Bilateral partial salpingectomy (modified Pomeroy technique).   SURGEON:  Charles A. Clearance Coots, MD   ANESTHESIA:  Spinal.   ESTIMATED BLOOD LOSS:  500 mL.   IV FLUIDS:  1600 mL.   URINE OUTPUT:  150 mL clear.   COMPLICATIONS:  None.  Foley to gravity.   FINDINGS:  Viable female at 12:42, Apgars of 9 at 1 minute and 9 at 5  minutes, weight of 6 pounds 5 ounces.  Normal uterus, ovaries, and  fallopian tubes.   SPECIMEN:  Approximately 2-cm segments of right and left fallopian  tubes, placenta.   DISPOSITION:  Specimen to Pathology.   OPERATION:  The patient was brought to the operating room and after  satisfactory spinal anesthesia, the abdomen was prepped and draped in  the usual sterile fashion.  A Pfannenstiel skin incision was made  through the previous scar with a scalpel down to the fascia.  The fascia  was nicked in the midline and the fascial incision was extended to left  and to right with curved Mayo scissors.  The superior and inferior  fascial edges were taken off the rectus muscles with blunt and sharp  dissection.  The rectus muscle was then bluntly divided in the midline  and the peritoneum was entered digitally and was digitally extended to  the left and to the right.  The bladder blade was then placed in the  incision.  The vesicouterine fold of peritoneum above the reflection of  the urinary bladder was grasped with forceps and was incised and  undermined with Metzenbaum scissors.  The incision was extended to left  and to the right with the Metzenbaum scissors.  The bladder flap was  then developed and the bladder blade was repositioned in front of the  urinary bladder placing it well out of the operative field.  Uterus was  then entered transversely in the lower uterine segment with a scalpel.  Clear amniotic fluid was expelled.  The uterine incision was extended to  the left and to the right digitally.  The vertex was then extended up  into the incision and was delivered with the aid of fundal pressure from  the assistant.  Infant's mouth and nose were suctioned with a suction  bulb and the delivery was completed with the aid of fundal pressure from  the assistant.  The umbilical cord was doubly clamped  and cut and the  infant was handed off to the nursery staff.  Cord blood was obtained and  the placenta was spontaneously expelled from the uterine cavity intact.  The endometrial surface was thoroughly debrided with a dry lap sponge.  The edges of the uterine incision were grasped with the ring forceps.  Uterus was closed with a continuous interlocking suture of 0 Monocryl  from each corner to the center.  Hemostasis was excellent.  Attention  was then turned above to the tubal ligation procedure.  The left  fallopian tube was grasped with a Babcock clamp and was identified from  the cornual end to the fimbrial end and grasped with Babcock clamp.  A  knuckle of tube in the isthmic area of the tube was then suture ligated  through the mesosalpinx with 0-plain catgut.  Section of tube above the  knot was then excised with Metzenbaum scissors and submitted to  Pathology for evaluation.  There was no active bleeding from the  tubal  stumps and therefore, placed back in its normal anatomic position.  Same  procedure was performed on the opposite side without complications.  The  abdomen was then closed as follows.  The parietal peritoneum was closed  with a continuous suture of 2-0 Monocryl.  The fascia was closed with a  continuous suture of the 0 Vicryl.  Subcutaneous tissue was thoroughly  irrigated with warm saline solution and all areas of subcutaneous  bleeding were coagulated with the Bovie.  The skin was then closed with  the continuous subcuticular suture of 4-0 Monocryl and Dermabond  adhesive was applied to the incision closure.  The surgical technician  indicated that all needle, sponge, and instrument counts were correct  x2.  The patient tolerated the procedure well and transported to the  recovery room in satisfactory condition.      Charles A. Clearance Coots, M.D.  Electronically Signed     CAH/MEDQ  D:  04/12/2008  T:  04/13/2008  Job:  [id number]

## 2010-08-24 NOTE — Procedures (Signed)
[place]  Patient:    Rebecca Edwards, Rebecca Edwards                      MRN: [id number] Proc. Date: 01/03/00 Adm. Date:  [id number] Attending:  Rich Brave CC:         ____________ Eveline Keto, M.D.   Procedure Report  PROCEDURE:  Colonoscopy with biopsies.  INDICATION:  The patient is a 25 year old female with unexplained diarrhea.  FINDINGS:  Normal exam to the terminal ileum.  INFORMED CONSENT:  The nature, purpose, and risks of the procedure have been discussed with the patient and her mother, the latter who provided written consent on her behalf since she is a minor.  SEDATION:  For this procedure and the upper endoscopy which preceded it totaled fentanyl 120 mcg and Versed 12 mg IV without arrhythmias (other than some sinus tachycardia) or desaturation.  DESCRIPTION OF PROCEDURE:  The Olympus PCF-160L pediatric video adjustable tension colonoscope was advanced fairly easily to the cecum and for a moderate distance into the terminal ileum, which had a normal mucosal appearance.  We did place the patient in the supine position and applied some external abdominal compression to help facilitate passage.  The terminal ileum had a completely normal appearance.  Several random mucosal biopsies were obtained from it.  The colon was similarly normal in appearance, without evidence of any colitis whatsoever.  There was an excellent vascular pattern.  No polyps, cancer, vascular malformations, or diverticulosis were noted.  Retroflexion was normal.  Pullout through the anal canal did not disclose any significant abnormalities.  The patient had a tampon in place which was palpable during the initial digital rectal exam.  The patient tolerated the procedure well, and there were no apparent complications.  IMPRESSION:  Normal colonoscopy to the terminal ileum.  PLAN:  Await pathology on todays biopsies.  Office follow-up in the near future. DD:   01/03/00 TD:  01/03/00 Job: [id number]

## 2010-08-24 NOTE — Procedures (Signed)
[place]  Patient:    Rebecca Edwards, Rebecca Edwards                      MRN: [id number] Proc. Date: 01/03/00 Adm. Date:  [id number] Attending:  Rich Brave CC:         Carola J. Gerri Spore, M.D.   Procedure Report  PROCEDURE:  Upper endoscopy with biopsy.  INDICATIONS:  A 25 year old with unexplained diarrhea and also occasional vomiting.  FINDINGS:  Normal examination, except for a small hiatal hernia.  PREPROCEDURE CONSENT:  The nature, purpose and risks of the procedure have been discussed with the patient and her mother, the latter of whom provided written consent on her behalf since the patient is a minor.  SEDATION:  Fentanyl 60 mcg; Versed 6 mg without arrhythmias (other than some sinus tachycardia), or desaturation.  DESCRIPTION OF PROCEDURE:  The Olympus video endoscope was passed under direct vision.  The vocal cords looked normal.  The esophagus was easily entered.  This was a normal examination, except for a small hiatal hernia.  The esophagus showed no evidence of reflux esophagitis, Barretts esophagus, varices, infection or neoplasia, and no ring or stricture was present.  The stomach contained no significant residual, just a little of bile.  No gastritis, erosions, ulcers, polyps or masses were noted; including retroflex view of the proximal stomach.  The pylorus, duodenal bulb and the second duodenum looked normal, including the mucosal pattern of the second duodenum. Several duodenal biopsies were obtained prior to removal of the scope.  The patient tolerated the procedure well.  There were no apparent complications.  IMPRESSION:  Normal examination, except for a small hiatal hernia.  No source of diarrhea endoscopically evident.  PLAN:  Await pathology and duodenal biopsies.  Proceed to colonoscopic evaluation. DD:  01/03/00 TD:  01/03/00 Job: [id number]

## 2010-08-24 NOTE — H&P (Signed)
NAMEJANELLA, ROGALA             ACCOUNT NO.:  [id number]   MEDICAL RECORD NO.:  [id number]          PATIENT TYPE:  INP   LOCATION:  9102                          FACILITY:  [facility]   PHYSICIAN:  Naima A. Dillard, M.D. DATE OF BIRTH:  05-Sep-1985   DATE OF ADMISSION:  03/22/2005  DATE OF DISCHARGE:                                HISTORY & PHYSICAL   Ms. Holtrop is a 25 year old gravida 1, para 0 (based on what I can tell  from this chart here), who is admitted at 38 weeks and 4 days gestation for  induction of labor secondary to intrauterine growth restriction.  The  patient with lagging fundal height and underwent ultrasound on the day of  admission, which revealed fetal growth at the 4th percentile.  Patient's  amniotic fluid volume was normal at 9 cm.  The patient had previously been  evaluated with ultrasound two weeks earlier with fetal growth at the 10th to  the 25th percentile secondary to lagging fundal height as well.  The  patient's pregnancy has been remarkable for:  1.  First-trimester bleeding.  2.  Rh negative.  3.  Preterm labor.  4.  History of abnormal Pap, status post colpo.  5.  History of depression/anxiety.  6.  History of irritable bowel syndrome and GERD.   PRENATAL LABORATORY:  Initial hemoglobin 12.8, platelets 300,000, blood type  A negative.  Antibody screen was positive secondary to RhoGAM.  Sickle cell  trait was negative.  RPR non-reactive.  Rubella titer immune.  Hepatitis B  surface antigen negative.  HIV is negative.  Gonorrhea and Chlamydia  negative.  Cystic fibrosis screen negative.  Quad screen negative.  Glucola  101, 28-week hemoglobin 10.2, antibody screen at 28 weeks negative.  Fetal  fibronectin negative at 30 weeks.  Fetal fibronectin negative at 33 weeks.  Group B strep negative at 37 weeks.  Gonorrhea and Chlamydia negative at 37  weeks.   HISTORY OF PRESENT PREGNANCY:  Patient entered care at [redacted] weeks gestation.  Patient's pregnancy  has been followed by the CNM Service at [facility].  The patient's last menstrual period began June 25, 2004.  However, her  due date was established by ultrasound.  Patient's [facility] April 01, 2005.  Ultrasound done at 7 weeks and 1 day gestation.  Patient with nausea and  vomiting, improved with Zofran in the first trimester.  Patient with  intermittent diarrhea throughout the first trimester.  Patient also with  anxiety attack at approximately [redacted] weeks gestation.  Patient underwent quad  screening at 18 weeks, repeat ultrasound done at 18 weeks revealed a single  intrauterine pregnancy consistent with a previous ultrasound, anterior  placenta, no previa, normal amniotic fluid, cervix of 3.2 cm, and normal  female with normal anatomy.  Patient was also started on counseling at the  [facility] secondary to increasing anxiety at around 18 weeks.  Patient  underwent Glucola at 26 weeks and received RhoGAM at 28 weeks.  Patient  underwent repeat ultrasound at 29 weeks due to lagging fundal height and low  weight gain.  Patient's  ultrasound at that time revealed intrauterine  pregnancy and breech presentation with estimated fetal weight at the 21st  percentile with normal amniotic fluid and a cervix of 4.6 cm.  Patient with  recurrent diarrhea and GI symptoms at 30 weeks treated at outpatient basis.  However, patient did have decreased fetal movement and was sent to [facility] for  monitoring.  Patient began having menstrual-type cramping, as well, at  approximately 30 weeks and again was evaluated at [facility] with  negative fetal fibronectin at that time.  Cervix remained long and closed  despite cramping.  Patient was given terbutaline to use as needed for  contractions from 33 weeks until term.  Ultrasound repeated at 36 weeks due  to lagging fundal height revealing estimated fetal weight at the 10th to  25th percentile and normal amniotic fluid.  Group B strep and  GC/Chlamydia  cultures were obtained at 37 weeks, and patient was continued occasional  emesis.  Patient seen at 38 weeks with a fundal height of 32 cm and,  therefore, ultrasound was repeated, which is the day of admission.   OBSTETRIC HISTORY:  Pregnancy #1 is present.   MEDICAL HISTORY:  1.  GERD.  2.  IBS.  3.  Asthma, exercise induced.  4.  Occasional UTIs.  5.  Depression and anxiety.   SURGICAL HISTORY:  Wisdom teeth 2003.   MEDICATIONS:  Prenatal vitamins.   ALLERGIES:  DARVOCET CAUSES ITCHING.   FAMILY HISTORY:  Paternal grandfather, heart disease.  Father with  hypertension.  Mother with anemia and depression and anxiety, alcohol abuse.  Maternal grandfather with diabetes.  Maternal grandmother, diabetes.   GENETIC HISTORY:  Positive for the father of the baby with sickle cell  trait, otherwise negative.   SOCIAL HISTORY:  Patient is a single African-American female.  Father of the  baby, Faith Rogue, is involved and supportive.  Patient denies use of  alcohol, tobacco, or street drugs.   REVIEW OF SYSTEMS:  Typical of one at term in pregnancy.   PHYSICAL EXAMINATION:  VITAL SIGNS:  Patient is afebrile.  Vital signs are  stable.  HEENT:  Within normal limits.  LUNGS:  Clear.  HEART:  Regular rate and rhythm without murmur, rub or gallop.  BREASTS:  Soft.  ABDOMEN:  Soft and gravid, non-tender with fundal height extending 32 cm  above the symphysis pubis.  Fetus is noted to be longitudinal lie and vertex  to Leopold's maneuvers.  Fetal heart rate 160s.  CERVIX:  Closed, 60% effaced, -1 station, anterior and soft.  EXTREMITIES:  No edema noted bilaterally.  DTRs are 2+, no clonus, and  negative Hohmann's bilaterally.   ASSESSMENT:  1.  Intrauterine pregnancy at 35 and 4/7 weeks.  2.  Intrauterine growth restriction.   PLAN:  1.  Consult obtained with Dr. Normand Sloop.  Findings of ultrasound discussed     with the patient and risk, benefits, and       alternatives of induction of labor discussed with the patient.  The      patient desires to proceed with induction of labor.  Risks, including      increased risk of cesarean section, were thoroughly discussed with the      patient.  2.  Patient will be admitted to [facility] for cervical ripening with      Cervidil.  Routine CNM orders otherwise.      Rhona Leavens, CNM      Naima A. Normand Sloop, M.D.  Electronically Signed    NOS/MEDQ  D:  03/22/2005  T:  03/23/2005  Job:  [id number]

## 2010-08-24 NOTE — Discharge Summary (Signed)
[name], [name]             ACCOUNT NO.:  [id number]   MEDICAL RECORD NO.:  [id number]          PATIENT TYPE:  INP   LOCATION:  9102                          [facility]ILITY[facility]  [facility]   PHYSICIAN:  Crist Fat. Rivard, M.D. DATE OF BIRTH:  03-Feb-1986   DATE OF ADMISSION:  03/22/2005  DATE OF DISCHARGE:  03/25/2005                                 DISCHARGE SUMMARY   ADMITTING DIAGNOSES:  1.  Intrauterine pregnancy at 38-and-a-half weeks.  2.  Intrauterine growth restriction.   PREOPERATIVE DIAGNOSIS:  Nonreassuring fetal heart rate tracing.   PROCEDURE:  Primary low transverse cesarean section.   POSTOPERATIVE DIAGNOSES:  1.  Intrauterine pregnancy at term, delivered.  2.  Intrauterine growth restriction.  3.  Nonreassuring fetal heart rate tracing.  4.  Primary low transverse cesarean section.   Rebecca Edwards is a 25 year old gravida 1 para 0 who was admitted at 54 and  four-sevenths weeks for induction of labor secondary to IUGR. Cervidil was  placed and fetal heart rate decelerations began. As these fetal heart rate  decelerations became worse, C-section was offered to the patient, risks and  benefits were reviewed. The patient opted to proceed with cesarean delivery.  This was accomplished by Dr. Jaymes Graff as surgeon with the birth of a 4-  pound 11-ounce female infant with Apgar scores of 8 at one minute, 8 at five  minutes. Both patient and infant have done well in the postoperative period.  Her hemoglobin on the first postoperative day was 10.1. Her vital signs have  remained stable. She has been afebrile. Her incision with sutures is clean,  dry and intact, and on this her third postoperative day she is judged to be  in satisfactory condition for discharge.   DISCHARGE INSTRUCTIONS:  Per [facility] handout.   DISCHARGE MEDICATIONS:  1.  Motrin 600 mg p.o. q.6h. p.r.n. pain.  2.  Tylox one to two p.o. q.3-4h. pain.  3.  prenatal vitamins.   Discharge follow-up  will be at [facility] in 6 weeks.      Rica Koyanagi, C.N.M.      Crist Fat Rivard, M.D.  Electronically Signed    SDM/MEDQ  D:  03/25/2005  T:  03/26/2005  Job:  [id number]

## 2010-08-24 NOTE — Op Note (Signed)
Rebecca Edwards, Rebecca Edwards             ACCOUNT NO.:  [id number]   MEDICAL RECORD NO.:  [id number]          PATIENT TYPE:  INP   LOCATION:  9102                          [facility]   PHYSICIAN:  Naima A. Dillard, M.D. DATE OF BIRTH:  Jun 18, 1985   DATE OF PROCEDURE:  03/22/2005  DATE OF DISCHARGE:                                 OPERATIVE REPORT   PREOPERATIVE DIAGNOSES:  1.  Intrauterine pregnancy at term.  2.  Intrauterine growth restriction.  3.  Non-reassuring fetal heart tones.   POSTOPERATIVE DIAGNOSES:  1.  Intrauterine pregnancy at term.  2.  Intrauterine growth restriction.  3.  Non-reassuring fetal heart tones.   OPERATION/PROCEDURE:  Primary low transverse cesarean section.   SURGEON:  Naima A. Normand Sloop, M.D.   ASSISTANT:  Elby Showers. Williams, C.N.M.   ANESTHESIA:  Spinal.   ESTIMATED BLOOD LOSS:  500 mL.   URINARY OUTPUT:  75 mL clear urine.   IV FLUIDS:  1000 mL.   COMPLICATIONS:  None.   SPECIMENS:  Placenta was sent to pathology.   FINDINGS:  Female infant in vertex presentation with Apgars of 8 and  __________.  Nuchal cord x1.  Clear fluid. Normal-appearing tubes, uterus  and ovaries and abdominal anatomy.   DESCRIPTION OF PROCEDURE:  The patient was taken to the operating room where  she was given spinal anesthesia, placed in the dorsal supine position with a  left lateral tilt and prepped and draped in the normal sterile fashion.  A  Foley catheter was placed.   Once the spinal was found to be adequate, a Pfannenstiel skin incision was  made with the scalpel 2 cm below the symphysis pubis and carried down to the  fascia.  The fascia was incised in the midline and extended bilaterally  using pickups with teeth and Mayo scissors.  Kochers x2 were placed on the  superior aspect of the fascia which was dissected off the rectus muscles  both sharply and bluntly and the inferior aspect of the rectus muscle was  dissected in a similar fashion.  The muscle  was separated in the midline and  the peritoneum was identified, tented up and entered sharply and extended  with good visualization of bowel and bladder. The bladder blade inserted and  the vesicouterine peritoneum was identified, tented up, entered sharply and  extended bilaterally.  A primary low transverse uterine incision was made  with the scalpel and extended  bluntly transversely bilaterally.  The  infant's bag was ruptured and noted to have clear fluid.  The head was  delivered without difficulty.  Mouth and nose were bulb-suctioned.  Nuchal  cord x1 was easily reduced.  Body was then delivered. Cord was clamped and  cut.  Cord gases obtained.  The PH was 7.38.  Cord blood was obtained.  Placenta was manually delivered.  Uterus was cleared of clot and debris.  Uterine incision was repaired with 0 Vicryl in a running locked fashion.  A  second layer of 0 Vicryl was used for imbrication of the uterus.  Hemostasis  was assured.  Irrigation was done and there  was a little bit of bleeding in  the middle of the uterus which was made hemostasis with figure-of-eight.  Irrigation was then done and hemostasis was assured.  The peritoneum was  closed using 0 chromic.  The fascia was closed using 0 Vicryl in a running  locked fashion.  Subcutaneous layer was made hemostatic with the Bovie  cautery.  The skin was closed with 3-0 Monocryl in a subcuticular fashion.  Sponge, lab and needle counts were correct x2.  The patient was taken to the  recovery room in stable condition.      Naima A. Normand Sloop, M.D.  Electronically Signed     NAD/MEDQ  D:  03/22/2005  T:  03/25/2005  Job:  [id number]

## 2010-11-06 ENCOUNTER — Emergency Department (HOSPITAL_COMMUNITY)
Admission: EM | Admit: 2010-11-06 | Discharge: 2010-11-06 | Disposition: A | Discharge status: Home / Self Care | Payer: Self-pay | Attending: Emergency Medicine | Admitting: Emergency Medicine

## 2010-11-06 DIAGNOSIS — F411 Generalized anxiety disorder: Secondary | ICD-10-CM | POA: Insufficient documentation

## 2010-11-06 DIAGNOSIS — M546 Pain in thoracic spine: Secondary | ICD-10-CM | POA: Insufficient documentation

## 2010-11-06 DIAGNOSIS — X58XXXA Exposure to other specified factors, initial encounter: Secondary | ICD-10-CM | POA: Insufficient documentation

## 2010-11-06 DIAGNOSIS — S239XXA Sprain of unspecified parts of thorax, initial encounter: Secondary | ICD-10-CM | POA: Insufficient documentation

## 2010-11-06 DIAGNOSIS — R509 Fever, unspecified: Secondary | ICD-10-CM | POA: Insufficient documentation

## 2010-11-12 ENCOUNTER — Inpatient Hospital Stay (INDEPENDENT_AMBULATORY_CARE_PROVIDER_SITE_OTHER)
Admission: RE | Admit: 2010-11-12 | Discharge: 2010-11-12 | Disposition: A | Discharge status: Home / Self Care | Payer: Self-pay | Source: Ambulatory Visit | Attending: Family Medicine | Admitting: Family Medicine

## 2010-11-12 ENCOUNTER — Ambulatory Visit (INDEPENDENT_AMBULATORY_CARE_PROVIDER_SITE_OTHER): Payer: Self-pay

## 2010-11-12 DIAGNOSIS — M546 Pain in thoracic spine: Secondary | ICD-10-CM

## 2011-01-01 LAB — CBC
HCT: 36.8
Hemoglobin: 12.6
MCHC: 34.3
MCV: 89.2
Platelets: 270
RBC: 4.13
RDW: 13.2
WBC: 10.3

## 2011-01-01 LAB — URINALYSIS, ROUTINE W REFLEX MICROSCOPIC
Bilirubin Urine: NEGATIVE
Glucose, UA: NEGATIVE
Hgb urine dipstick: NEGATIVE
Ketones, ur: 15 — AB
Nitrite: NEGATIVE
Protein, ur: NEGATIVE
Specific Gravity, Urine: 1.023
Urobilinogen, UA: 0.2
pH: 6

## 2011-01-01 LAB — DIFFERENTIAL
Basophils Absolute: 0.1
Basophils Relative: 1
Eosinophils Absolute: 0
Eosinophils Relative: 0
Lymphocytes Relative: 15
Lymphs Abs: 1.5
Monocytes Absolute: 0.6
Monocytes Relative: 5
Neutro Abs: 8.2 — ABNORMAL HIGH
Neutrophils Relative %: 79 — ABNORMAL HIGH

## 2011-01-01 LAB — BASIC METABOLIC PANEL
BUN: 7
CO2: 24
Calcium: 8.7
Chloride: 106
Creatinine, Ser: 0.75
GFR calc Af Amer: >60
GFR calc non Af Amer: >60
Glucose, Bld: 100 — ABNORMAL HIGH
Potassium: 3.1 — ABNORMAL LOW
Sodium: 137

## 2011-01-01 LAB — HCG, QUANTITATIVE, PREGNANCY: hCG, Beta Chain, Quant, S: 795 — ABNORMAL HIGH

## 2011-01-01 LAB — POCT PREGNANCY, URINE
Operator id: 261601
Preg Test, Ur: POSITIVE

## 2011-01-02 LAB — POCT I-STAT, CHEM 8
BUN: 6
Calcium, Ion: 1.19
Chloride: 102
Creatinine, Ser: 0.8
Glucose, Bld: 85
HCT: 38
Hemoglobin: 12.9
Potassium: 3.6
Sodium: 136
TCO2: 21

## 2011-01-02 LAB — URINALYSIS, ROUTINE W REFLEX MICROSCOPIC
Bilirubin Urine: NEGATIVE
Bilirubin Urine: NEGATIVE
Glucose, UA: NEGATIVE
Glucose, UA: NEGATIVE
Hgb urine dipstick: NEGATIVE
Ketones, ur: 15 — AB
Ketones, ur: NEGATIVE
Leukocytes, UA: NEGATIVE
Nitrite: NEGATIVE
Nitrite: NEGATIVE
Protein, ur: NEGATIVE
Protein, ur: NEGATIVE
Specific Gravity, Urine: 1.015
Specific Gravity, Urine: 1.015
Urobilinogen, UA: 0.2
Urobilinogen, UA: 0.2
pH: 7
pH: 7

## 2011-01-02 LAB — URINE MICROSCOPIC-ADD ON

## 2011-01-02 LAB — T4, FREE: Free T4: 1.58

## 2011-01-02 LAB — TSH: TSH: 0.025 — ABNORMAL LOW

## 2011-01-08 LAB — URINALYSIS, ROUTINE W REFLEX MICROSCOPIC
Bilirubin Urine: NEGATIVE
Glucose, UA: NEGATIVE
Hgb urine dipstick: NEGATIVE
Ketones, ur: NEGATIVE
Nitrite: NEGATIVE
Protein, ur: NEGATIVE
Specific Gravity, Urine: 1.01
Urobilinogen, UA: 0.2
pH: 6

## 2011-01-11 LAB — RH IMMUNE GLOBULIN WORKUP (NOT WOMEN'S HOSP)
ABO/RH(D): A NEG
Antibody Screen: NEGATIVE

## 2011-01-21 LAB — BASIC METABOLIC PANEL
BUN: 7
CO2: 25
Calcium: 8.8
Chloride: 107
Creatinine, Ser: 0.75
GFR calc Af Amer: >60
GFR calc non Af Amer: >60
Glucose, Bld: 97
Potassium: 4
Sodium: 136

## 2011-01-21 LAB — URINALYSIS, ROUTINE W REFLEX MICROSCOPIC
Bilirubin Urine: NEGATIVE
Glucose, UA: NEGATIVE
Hgb urine dipstick: NEGATIVE
Ketones, ur: NEGATIVE
Nitrite: NEGATIVE
Protein, ur: NEGATIVE
Specific Gravity, Urine: 1.026
Urobilinogen, UA: 0.2
pH: 6.5

## 2011-01-21 LAB — CBC
HCT: 38.1
Hemoglobin: 13.1
MCHC: 34.3
MCV: 88.5
Platelets: 244
RBC: 4.3
RDW: 11.9
WBC: 8.5

## 2011-01-21 LAB — POCT INFECTIOUS MONO SCREEN: Mono Screen: NEGATIVE

## 2011-01-21 LAB — DIFFERENTIAL
Basophils Absolute: 0.1
Basophils Relative: 1
Eosinophils Absolute: 0.1
Eosinophils Relative: 1
Lymphocytes Relative: 33
Lymphs Abs: 2.8
Monocytes Absolute: 0.6
Monocytes Relative: 7
Neutro Abs: 4.9
Neutrophils Relative %: 59

## 2011-01-21 LAB — POCT RAPID STREP A: Streptococcus, Group A Screen (Direct): NEGATIVE

## 2011-01-21 LAB — PREGNANCY, URINE: Preg Test, Ur: NEGATIVE

## 2012-10-07 ENCOUNTER — Other Ambulatory Visit: Payer: Self-pay | Admitting: Family Medicine

## 2012-10-07 DIAGNOSIS — E041 Nontoxic single thyroid nodule: Secondary | ICD-10-CM

## 2012-10-13 ENCOUNTER — Ambulatory Visit
Admission: RE | Admit: 2012-10-13 | Discharge: 2012-10-13 | Disposition: A | Discharge status: Home / Self Care | Payer: Medicaid Other | Source: Ambulatory Visit | Attending: Family Medicine | Admitting: Family Medicine

## 2012-10-13 DIAGNOSIS — E041 Nontoxic single thyroid nodule: Secondary | ICD-10-CM

## 2013-07-22 ENCOUNTER — Other Ambulatory Visit (HOSPITAL_COMMUNITY)
Admission: RE | Admit: 2013-07-22 | Discharge: 2013-07-22 | Disposition: A | Discharge status: Home / Self Care | Payer: Medicaid Other | Source: Ambulatory Visit | Attending: Obstetrics and Gynecology | Admitting: Obstetrics and Gynecology

## 2013-07-22 ENCOUNTER — Other Ambulatory Visit: Payer: Self-pay | Admitting: Obstetrics and Gynecology

## 2013-07-22 DIAGNOSIS — Z1151 Encounter for screening for human papillomavirus (HPV): Secondary | ICD-10-CM | POA: Insufficient documentation

## 2013-07-22 DIAGNOSIS — Z01419 Encounter for gynecological examination (general) (routine) without abnormal findings: Secondary | ICD-10-CM | POA: Insufficient documentation

## 2013-09-01 ENCOUNTER — Emergency Department (HOSPITAL_COMMUNITY)
Admission: EM | Admit: 2013-09-01 | Discharge: 2013-09-01 | Discharge status: Absent without leave | Payer: Medicaid Other | Source: Home / Self Care

## 2014-08-11 ENCOUNTER — Other Ambulatory Visit (HOSPITAL_COMMUNITY)
Admission: RE | Admit: 2014-08-11 | Discharge: 2014-08-11 | Disposition: A | Discharge status: Home / Self Care | Payer: BC Managed Care – PPO | Source: Ambulatory Visit | Attending: Obstetrics and Gynecology | Admitting: Obstetrics and Gynecology

## 2014-08-11 ENCOUNTER — Other Ambulatory Visit (HOSPITAL_COMMUNITY): Payer: Self-pay | Admitting: Obstetrics and Gynecology

## 2014-08-11 DIAGNOSIS — Z01411 Encounter for gynecological examination (general) (routine) with abnormal findings: Secondary | ICD-10-CM | POA: Insufficient documentation

## 2014-08-11 DIAGNOSIS — Z113 Encounter for screening for infections with a predominantly sexual mode of transmission: Secondary | ICD-10-CM | POA: Diagnosis present

## 2014-08-15 LAB — CYTOLOGY - PAP

## 2014-09-27 ENCOUNTER — Other Ambulatory Visit: Payer: Self-pay | Admitting: Family Medicine

## 2014-09-27 DIAGNOSIS — E041 Nontoxic single thyroid nodule: Secondary | ICD-10-CM

## 2014-09-30 ENCOUNTER — Ambulatory Visit
Admission: RE | Admit: 2014-09-30 | Discharge: 2014-09-30 | Disposition: A | Discharge status: Home / Self Care | Payer: BC Managed Care – PPO | Source: Ambulatory Visit | Attending: Family Medicine | Admitting: Family Medicine

## 2014-09-30 DIAGNOSIS — E041 Nontoxic single thyroid nodule: Secondary | ICD-10-CM

## 2014-10-06 ENCOUNTER — Other Ambulatory Visit: Payer: Self-pay | Admitting: Internal Medicine

## 2014-10-06 DIAGNOSIS — E041 Nontoxic single thyroid nodule: Secondary | ICD-10-CM

## 2014-10-11 ENCOUNTER — Other Ambulatory Visit (HOSPITAL_COMMUNITY)
Admission: RE | Admit: 2014-10-11 | Discharge: 2014-10-11 | Disposition: A | Discharge status: Home / Self Care | Payer: BC Managed Care – PPO | Source: Ambulatory Visit | Attending: Interventional Radiology | Admitting: Interventional Radiology

## 2014-10-11 ENCOUNTER — Ambulatory Visit
Admission: RE | Admit: 2014-10-11 | Discharge: 2014-10-11 | Disposition: A | Discharge status: Home / Self Care | Payer: BC Managed Care – PPO | Source: Ambulatory Visit | Attending: Internal Medicine | Admitting: Internal Medicine

## 2014-10-11 DIAGNOSIS — E041 Nontoxic single thyroid nodule: Secondary | ICD-10-CM

## 2015-08-18 ENCOUNTER — Other Ambulatory Visit: Payer: Self-pay | Admitting: Obstetrics and Gynecology

## 2015-08-18 ENCOUNTER — Other Ambulatory Visit (HOSPITAL_COMMUNITY)
Admission: RE | Admit: 2015-08-18 | Discharge: 2015-08-18 | Disposition: A | Discharge status: Home / Self Care | Payer: BC Managed Care – PPO | Source: Ambulatory Visit | Attending: Obstetrics and Gynecology | Admitting: Obstetrics and Gynecology

## 2015-08-18 DIAGNOSIS — Z01419 Encounter for gynecological examination (general) (routine) without abnormal findings: Secondary | ICD-10-CM | POA: Insufficient documentation

## 2015-08-18 DIAGNOSIS — Z113 Encounter for screening for infections with a predominantly sexual mode of transmission: Secondary | ICD-10-CM | POA: Diagnosis present

## 2015-08-23 LAB — CYTOLOGY - PAP

## 2015-10-11 ENCOUNTER — Other Ambulatory Visit: Payer: Self-pay | Admitting: Internal Medicine

## 2015-10-11 DIAGNOSIS — E049 Nontoxic goiter, unspecified: Secondary | ICD-10-CM

## 2015-10-16 ENCOUNTER — Other Ambulatory Visit: Payer: BC Managed Care – PPO

## 2015-10-16 ENCOUNTER — Ambulatory Visit
Admission: RE | Admit: 2015-10-16 | Discharge: 2015-10-16 | Disposition: A | Discharge status: Home / Self Care | Payer: BC Managed Care – PPO | Source: Ambulatory Visit | Attending: Internal Medicine | Admitting: Internal Medicine

## 2015-10-16 DIAGNOSIS — E049 Nontoxic goiter, unspecified: Secondary | ICD-10-CM

## 2016-05-13 ENCOUNTER — Other Ambulatory Visit: Payer: Self-pay | Admitting: Internal Medicine

## 2016-05-13 DIAGNOSIS — E042 Nontoxic multinodular goiter: Secondary | ICD-10-CM

## 2016-05-22 ENCOUNTER — Ambulatory Visit
Admission: RE | Admit: 2016-05-22 | Discharge: 2016-05-22 | Disposition: A | Discharge status: Home / Self Care | Payer: BC Managed Care – PPO | Source: Ambulatory Visit | Attending: Internal Medicine | Admitting: Internal Medicine

## 2016-05-22 DIAGNOSIS — E042 Nontoxic multinodular goiter: Secondary | ICD-10-CM

## 2016-10-01 ENCOUNTER — Encounter (HOSPITAL_COMMUNITY): Payer: Self-pay | Admitting: Family Medicine

## 2016-10-01 DIAGNOSIS — R102 Pelvic and perineal pain: Secondary | ICD-10-CM | POA: Insufficient documentation

## 2016-10-01 DIAGNOSIS — R110 Nausea: Secondary | ICD-10-CM | POA: Diagnosis not present

## 2016-10-01 LAB — URINALYSIS, ROUTINE W REFLEX MICROSCOPIC
Bilirubin Urine: NEGATIVE
Glucose, UA: NEGATIVE mg/dL
Ketones, ur: NEGATIVE mg/dL
Leukocytes, UA: NEGATIVE
Nitrite: NEGATIVE
Protein, ur: NEGATIVE mg/dL
Specific Gravity, Urine: 1.021 (ref 1.005–1.030)
pH: 5 (ref 5.0–8.0)

## 2016-10-01 LAB — COMPREHENSIVE METABOLIC PANEL
ALT: 14 U/L (ref 14–54)
AST: 19 U/L (ref 15–41)
Albumin: 4.3 g/dL (ref 3.5–5.0)
Alkaline Phosphatase: 59 U/L (ref 38–126)
Anion gap: 6 (ref 5–15)
BUN: 14 mg/dL (ref 6–20)
CO2: 23 mmol/L (ref 22–32)
Calcium: 8.9 mg/dL (ref 8.9–10.3)
Chloride: 109 mmol/L (ref 101–111)
Creatinine, Ser: 0.83 mg/dL (ref 0.44–1.00)
GFR calc Af Amer: >60 mL/min (ref >60)
GFR calc non Af Amer: >60 mL/min (ref >60)
Glucose, Bld: 113 mg/dL — ABNORMAL HIGH (ref 65–99)
Potassium: 3.5 mmol/L (ref 3.5–5.1)
Sodium: 138 mmol/L (ref 135–145)
Total Bilirubin: 0.3 mg/dL (ref 0.3–1.2)
Total Protein: 7.5 g/dL (ref 6.5–8.1)

## 2016-10-01 LAB — CBC
HCT: 37 % (ref 36.0–46.0)
Hemoglobin: 12.4 g/dL (ref 12.0–15.0)
MCH: 28.4 pg (ref 26.0–34.0)
MCHC: 33.5 g/dL (ref 30.0–36.0)
MCV: 84.9 fL (ref 78.0–100.0)
Platelets: 295 10*3/uL (ref 150–400)
RBC: 4.36 MIL/uL (ref 3.87–5.11)
RDW: 13.7 % (ref 11.5–15.5)
WBC: 9.7 10*3/uL (ref 4.0–10.5)

## 2016-10-01 LAB — LIPASE, BLOOD: Lipase: 45 U/L (ref 11–51)

## 2016-10-01 LAB — POC URINE PREG, ED: Preg Test, Ur: NEGATIVE

## 2016-10-01 MED ORDER — ONDANSETRON 4 MG PO TBDP
4.0000 mg | ORAL_TABLET | Freq: Once | ORAL | Status: AC | PRN
  Administered 2016-10-01: 4 mg via ORAL
  Filled 2016-10-01: qty 1

## 2016-10-01 NOTE — ED Triage Notes (Signed)
Patient is complaining of lower abd pain and mid-lower back pain. Symptoms started 2 days and back pain started tonight. Associated symptoms are nausea, diarrhea, bloating and increased frequency.

## 2016-10-02 ENCOUNTER — Emergency Department (HOSPITAL_COMMUNITY)
Admission: EM | Admit: 2016-10-02 | Discharge: 2016-10-02 | Discharge status: Against Medical Advice | Payer: BC Managed Care – PPO | Attending: Emergency Medicine | Admitting: Emergency Medicine

## 2016-10-02 DIAGNOSIS — R102 Pelvic and perineal pain: Secondary | ICD-10-CM | POA: Diagnosis not present

## 2016-10-02 DIAGNOSIS — R110 Nausea: Secondary | ICD-10-CM

## 2016-10-02 HISTORY — DX: Disorder of thyroid, unspecified: E07.9

## 2016-10-02 HISTORY — DX: Unspecified ovarian cyst, unspecified side: N83.209

## 2016-10-02 HISTORY — DX: Panic disorder (episodic paroxysmal anxiety): F41.0

## 2016-10-02 LAB — WET PREP, GENITAL
Clue Cells Wet Prep HPF POC: NONE SEEN
Sperm: NONE SEEN
Trich, Wet Prep: NONE SEEN
Yeast Wet Prep HPF POC: NONE SEEN

## 2016-10-02 LAB — RPR: RPR Ser Ql: NONREACTIVE

## 2016-10-02 LAB — HIV ANTIBODY (ROUTINE TESTING W REFLEX): HIV Screen 4th Generation wRfx: NONREACTIVE

## 2016-10-02 MED ORDER — HYDROMORPHONE HCL 1 MG/ML IJ SOLN: 1.0000 mg | Freq: Once | INTRAMUSCULAR | Status: DC

## 2016-10-02 NOTE — ED Notes (Signed)
When the EDP returned to pt room, the pt had left without notifying staff.

## 2016-10-02 NOTE — ED Provider Notes (Signed)
[facility] DEPT Provider Note   CSN: [id number] Arrival date & time: 10/01/16  2141     History   Chief Complaint Chief Complaint  Patient presents with  . Abdominal Pain  . Back Pain    HPI UMAIZA MATUSIK is a 31 y.o. female.  REBECCAH IVINS is a 31 y.o. Female who presents to the emergency room complaining of suprapubic abdominal pain and bilateral low back pain for the past 3 days. Patient reports 3 days ago she had gradual onset of suprapubic abdominal pain that is worse with a full bladder. She also reports yesterday developing some bilateral back pain after she "stood up wrong." She reports nausea but no vomiting. She reports 2 episodes of diarrhea yesterday. She reports some urinary frequency but denies other urinary symptoms. She denies vaginal bleeding or vaginal discharge. She is sexually active and does not use protection. Last menstrual cycle was earlier in June. Previous abdominal surgical history includes a cesarean section and tubal ligation. She denies fevers, vomiting, dysuria, hematuria, loss of bladder control, loss of bowel control, numbness, tingling, weakness, falls, history of kidney stones, chest pain or shortness of breath or rashes.   The history is provided by the patient and medical records. No language interpreter was used.  Abdominal Pain   Associated symptoms include diarrhea, nausea and frequency. Pertinent negatives include fever, vomiting, dysuria, hematuria and headaches.  Back Pain   Associated symptoms include abdominal pain. Pertinent negatives include no chest pain, no fever, no numbness, no headaches, no dysuria and no weakness.    Past Medical History:  Diagnosis Date  . Ovarian cyst   . Panic attacks   . Thyroid disease    Hyper    Patient Active Problem List   Diagnosis Date Noted  . PALPITATIONS 08/24/2009  . HYPERTHYROIDISM 04/28/2009  . ANXIETY 04/28/2009  . WEIGHT LOSS, ABNORMAL 05/07/2007    Past Surgical History:    Procedure Laterality Date  . CESAREAN SECTION    . TUBAL LIGATION      OB History    No data available       Home Medications    Prior to Admission medications   Medication Sig Start Date End Date Taking? Authorizing Provider  acetaminophen (TYLENOL) 500 MG tablet Take 500 mg by mouth every 6 (six) hours as needed for mild pain, moderate pain, fever or headache.   Yes [provider]  cholecalciferol (VITAMIN D) 1000 units tablet Take 1,000 Units by mouth daily.   Yes [provider]  clonazePAM (KLONOPIN) 0.5 MG tablet Take 0.25-0.5 mg by mouth 2 (two) times daily as needed for anxiety.   Yes [provider]  Multiple Vitamin (MULTIVITAMIN WITH MINERALS) TABS tablet Take 1 tablet by mouth daily.   Yes [provider]  Omega-3 Fatty Acids (FISH OIL PO) Take 1 capsule by mouth daily.   Yes [provider]    Family History History reviewed. No pertinent family history.  Social History Social History  Substance Use Topics  . Smoking status: Never Smoker  . Smokeless tobacco: Never Used  . Alcohol use No     Allergies   Amoxicillin; Hydrocodone-acetaminophen; Propoxyphene n-acetaminophen; and Pseudoephedrine   Review of Systems Review of Systems  Constitutional: Negative for chills and fever.  HENT: Negative for congestion and sore throat.   Eyes: Negative for visual disturbance.  Respiratory: Negative for cough, shortness of breath and wheezing.   Cardiovascular: Negative for chest pain.  Gastrointestinal: Positive for abdominal  pain, diarrhea and nausea. Negative for blood in stool and vomiting.  Genitourinary: Positive for frequency. Negative for difficulty urinating, dysuria, flank pain, hematuria and urgency.  Musculoskeletal: Positive for back pain. Negative for neck pain.  Skin: Negative for rash.  Neurological: Negative for weakness, light-headedness, numbness and headaches.     Physical Exam Updated Vital  Signs BP 124/80 (BP Location: Right Arm)   Pulse 68   Temp 98.4 F (36.9 C) (Oral)   Resp 18   Ht 5\' 2"  (1.575 m)   Wt 51.3 kg (113 lb)   LMP 09/14/2016   SpO2 100%   BMI 20.67 kg/m   Physical Exam  Constitutional: She appears well-developed and well-nourished. No distress.  Nontoxic-appearing.  HENT:  Head: Normocephalic and atraumatic.  Mouth/Throat: Oropharynx is clear and moist.  Eyes: Conjunctivae are normal. Pupils are equal, round, and reactive to light. Right eye exhibits no discharge. Left eye exhibits no discharge.  Neck: Neck supple.  Cardiovascular: Normal rate, regular rhythm, normal heart sounds and intact distal pulses.  Exam reveals no gallop and no friction rub.   No murmur heard. Pulmonary/Chest: Effort normal and breath sounds normal. No respiratory distress. She has no wheezes. She has no rales.  Abdominal: Soft. Bowel sounds are normal. She exhibits no distension and no mass. There is tenderness. There is no guarding.  Abdomen is soft. Bowel sounds are present. Patient has mild suprapubic abdominal tenderness to palpation. No adnexal tenderness. No peritoneal signs. No CVA or flank tenderness. No psoas or obturator sign.  Genitourinary:  Genitourinary Comments: Pelvic exam with female PA student as chaperone. Normal physiologic discharge. Cervix is closed. No cervical motion tenderness. No adnexal tenderness or fullness.  Musculoskeletal: She exhibits no edema.  Lymphadenopathy:    She has no cervical adenopathy.  Neurological: She is alert. Coordination normal.  Skin: Skin is warm and dry. Capillary refill takes less than 2 seconds. No rash noted. She is not diaphoretic. No erythema. No pallor.  Psychiatric: She has a normal mood and affect. Her behavior is normal.  Nursing note and vitals reviewed.    ED Treatments / Results  Labs (all labs ordered are listed, but only abnormal results are displayed) Labs Reviewed  COMPREHENSIVE METABOLIC PANEL -  Abnormal; Notable for the following:       Result Value   Glucose, Bld 113 (*)    All other components within normal limits  URINALYSIS, ROUTINE W REFLEX MICROSCOPIC - Abnormal; Notable for the following:    APPearance HAZY (*)    Hgb urine dipstick SMALL (*)    Bacteria, UA RARE (*)    Squamous Epithelial / LPF 0-5 (*)    All other components within normal limits  WET PREP, GENITAL  LIPASE, BLOOD  CBC  RPR  HIV ANTIBODY (ROUTINE TESTING)  POC URINE PREG, ED  GC/CHLAMYDIA PROBE AMP ([place]) NOT AT [facility]    EKG  EKG Interpretation None       Radiology No results found.  Procedures Procedures (including critical care time)  Medications Ordered in ED Medications  ondansetron (ZOFRAN-ODT) disintegrating tablet 4 mg (4 mg Oral Given 10/01/16 2247)     Initial Impression / Assessment and Plan / ED Course  I have reviewed the triage vital signs and the nursing notes.  Pertinent labs & imaging results that were available during my care of the patient were reviewed by me and considered in my medical decision making (see chart for details).    This  is  a 31 y.o. Female who presents to the emergency room complaining of suprapubic abdominal pain and bilateral low back pain for the past 3 days. Patient reports 3 days ago she had gradual onset of suprapubic abdominal pain that is worse with a full bladder. She also reports yesterday developing some bilateral back pain after she "stood up wrong." She reports nausea but no vomiting. She reports 2 episodes of diarrhea yesterday. She reports some urinary frequency but denies other urinary symptoms. She denies vaginal bleeding or vaginal discharge. She is sexually active and does not use protection. Last menstrual cycle was earlier in June. Previous abdominal surgical history includes a cesarean section and tubal ligation. She denies fevers, vomiting, dysuria, vaginal bleeding or discharge.   On exam the patient is afebrile  nontoxic-appearing. Her mucous membranes are moist. Her abdomen is soft and just mild suprapubic abdominal tenderness to palpation. No adnexal tenderness. No peritoneal signs. No CVA or flank tenderness. On pelvic exam she has normal physiologic discharge. No cervical motion tenderness. No adnexal tenderness or fullness.  Pregnancy test is negative. Lipase is within normal limits. CMP is unremarkable. CBC is unremarkable. Urinalysis is with outside infection.  A short while after doing pelvic exam I noticed the patient's room was empty and had been cleaned. Could not find patient. She has eloped. Unable to get in contact patient. RN and Garment/textile technologist were unaware she had left. I informed them she had left and we all looked for the patient. Staff and I were unaware she was wanting to leave either.     Final Clinical Impressions(s) / ED Diagnoses   Final diagnoses:  Suprapubic abdominal pain  Nausea    New Prescriptions New Prescriptions   No medications on file     Sharmaine Base 10/02/16 [place], MD 10/02/16 1524

## 2016-10-03 LAB — GC/CHLAMYDIA PROBE AMP (~~LOC~~) NOT AT ARMC
Chlamydia: NEGATIVE
Neisseria Gonorrhea: NEGATIVE

## 2017-08-10 ENCOUNTER — Emergency Department (HOSPITAL_COMMUNITY)
Admission: EM | Admit: 2017-08-10 | Discharge: 2017-08-10 | Disposition: A | Discharge status: Home / Self Care | Payer: BC Managed Care – PPO | Attending: Emergency Medicine | Admitting: Emergency Medicine

## 2017-08-10 ENCOUNTER — Encounter (HOSPITAL_COMMUNITY): Payer: Self-pay | Admitting: Emergency Medicine

## 2017-08-10 DIAGNOSIS — F419 Anxiety disorder, unspecified: Secondary | ICD-10-CM | POA: Insufficient documentation

## 2017-08-10 DIAGNOSIS — F331 Major depressive disorder, recurrent, moderate: Secondary | ICD-10-CM | POA: Diagnosis not present

## 2017-08-10 DIAGNOSIS — Z046 Encounter for general psychiatric examination, requested by authority: Secondary | ICD-10-CM | POA: Insufficient documentation

## 2017-08-10 DIAGNOSIS — Z79899 Other long term (current) drug therapy: Secondary | ICD-10-CM | POA: Insufficient documentation

## 2017-08-10 DIAGNOSIS — F329 Major depressive disorder, single episode, unspecified: Secondary | ICD-10-CM | POA: Diagnosis present

## 2017-08-10 LAB — CBC WITH DIFFERENTIAL/PLATELET
Basophils Absolute: 0 10*3/uL (ref 0.0–0.1)
Basophils Relative: 0 %
Eosinophils Absolute: 0 10*3/uL (ref 0.0–0.7)
Eosinophils Relative: 0 %
HCT: 39.2 % (ref 36.0–46.0)
Hemoglobin: 12.3 g/dL (ref 12.0–15.0)
Lymphocytes Relative: 14 %
Lymphs Abs: 1.3 10*3/uL (ref 0.7–4.0)
MCH: 27.3 pg (ref 26.0–34.0)
MCHC: 31.4 g/dL (ref 30.0–36.0)
MCV: 86.9 fL (ref 78.0–100.0)
Monocytes Absolute: 0.6 10*3/uL (ref 0.1–1.0)
Monocytes Relative: 6 %
Neutro Abs: 7.6 10*3/uL (ref 1.7–7.7)
Neutrophils Relative %: 80 %
Platelets: 370 10*3/uL (ref 150–400)
RBC: 4.51 MIL/uL (ref 3.87–5.11)
RDW: 14.1 % (ref 11.5–15.5)
WBC: 9.6 10*3/uL (ref 4.0–10.5)

## 2017-08-10 LAB — COMPREHENSIVE METABOLIC PANEL
ALT: 13 U/L — ABNORMAL LOW (ref 14–54)
AST: 16 U/L (ref 15–41)
Albumin: 4.1 g/dL (ref 3.5–5.0)
Alkaline Phosphatase: 56 U/L (ref 38–126)
Anion gap: 10 (ref 5–15)
BUN: 10 mg/dL (ref 6–20)
CO2: 23 mmol/L (ref 22–32)
Calcium: 9.1 mg/dL (ref 8.9–10.3)
Chloride: 107 mmol/L (ref 101–111)
Creatinine, Ser: 0.86 mg/dL (ref 0.44–1.00)
GFR calc Af Amer: >60 mL/min (ref >60)
GFR calc non Af Amer: >60 mL/min (ref >60)
Glucose, Bld: 99 mg/dL (ref 65–99)
Potassium: 3.9 mmol/L (ref 3.5–5.1)
Sodium: 140 mmol/L (ref 135–145)
Total Bilirubin: 1 mg/dL (ref 0.3–1.2)
Total Protein: 7.8 g/dL (ref 6.5–8.1)

## 2017-08-10 LAB — RAPID URINE DRUG SCREEN, HOSP PERFORMED
Amphetamines: NOT DETECTED
Barbiturates: NOT DETECTED
Benzodiazepines: NOT DETECTED
Cocaine: NOT DETECTED
Opiates: NOT DETECTED
Tetrahydrocannabinol: NOT DETECTED

## 2017-08-10 LAB — I-STAT BETA HCG BLOOD, ED (MC, WL, AP ONLY): I-stat hCG, quantitative: <5 m[IU]/mL (ref <5)

## 2017-08-10 LAB — ETHANOL: Alcohol, Ethyl (B): <10 mg/dL (ref <10)

## 2017-08-10 LAB — TSH: TSH: 0.391 u[IU]/mL (ref 0.350–4.500)

## 2017-08-10 MED ORDER — CITALOPRAM HYDROBROMIDE 10 MG PO TABS: 10.0000 mg | ORAL_TABLET | Freq: Every day | ORAL | 2 refills | Status: DC

## 2017-08-10 MED ORDER — CITALOPRAM HYDROBROMIDE 10 MG PO TABS: 10.0000 mg | ORAL_TABLET | Freq: Every day | ORAL | Status: DC
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
## 2017-08-10 NOTE — ED Provider Notes (Signed)
[place] DEPT Provider Note   CSN: [id number] Arrival date & time: 08/10/17  1103     History   Chief Complaint Chief Complaint  Patient presents with  . Depression    HPI Rebecca Edwards is a 32 y.o. female.  HPI   32 yo F with PMHx HTN here with depression. Pt states that after the death of her grandmother 1 year ago, she's noticed increasing dysphoric mood. More recently, she ended a relationship and has also had increased stress at work. She endorses anhedonia, poor energy, poor appetite, depressive thoughts. She is no longer eating much at all 2/2 loss of appetite. Denies any SI, HI, or AVH but does state she occasionally thinks her family would be better off without her, if she "went away." Denies any h/o hospitalizations. Does have h/o thyroid disease but states this is being monitored and is under control. H/o depression during prior pregnancies.  Past Medical History:  Diagnosis Date  . Ovarian cyst   . Panic attacks   . Thyroid disease    Hyper    Patient Active Problem List   Diagnosis Date Noted  . PALPITATIONS 08/24/2009  . HYPERTHYROIDISM 04/28/2009  . ANXIETY 04/28/2009  . WEIGHT LOSS, ABNORMAL 05/07/2007    Past Surgical History:  Procedure Laterality Date  . CESAREAN SECTION    . TUBAL LIGATION       OB History   None      Home Medications    Prior to Admission medications   Medication Sig Start Date End Date Taking? Authorizing Provider  acetaminophen (TYLENOL) 500 MG tablet Take 500 mg by mouth every 6 (six) hours as needed for mild pain, moderate pain, fever or headache.    [provider]  cholecalciferol (VITAMIN D) 1000 units tablet Take 1,000 Units by mouth daily.    [provider]  clonazePAM (KLONOPIN) 0.5 MG tablet Take 0.25-0.5 mg by mouth 2 (two) times daily as needed for anxiety.    [provider]  Multiple Vitamin (MULTIVITAMIN WITH MINERALS) TABS tablet Take 1  tablet by mouth daily.    [provider]  Omega-3 Fatty Acids (FISH OIL PO) Take 1 capsule by mouth daily.    [provider]    Family History No family history on file.  Social History Social History   Tobacco Use  . Smoking status: Never Smoker  . Smokeless tobacco: Never Used  Substance Use Topics  . Alcohol use: No  . Drug use: No     Allergies   Amoxicillin; Hydrocodone-acetaminophen; Propoxyphene n-acetaminophen; and Pseudoephedrine   Review of Systems Review of Systems  Constitutional: Positive for fatigue. Negative for chills and fever.  HENT: Negative for congestion, rhinorrhea and sore throat.   Eyes: Negative for visual disturbance.  Respiratory: Negative for cough, shortness of breath and wheezing.   Cardiovascular: Negative for chest pain and leg swelling.  Gastrointestinal: Negative for abdominal pain, diarrhea, nausea and vomiting.  Genitourinary: Negative for dysuria, flank pain, vaginal bleeding and vaginal discharge.  Musculoskeletal: Negative for neck pain.  Skin: Negative for rash.  Allergic/Immunologic: Negative for immunocompromised state.  Neurological: Negative for syncope and headaches.  Hematological: Does not bruise/bleed easily.  Psychiatric/Behavioral: Positive for decreased concentration and dysphoric mood. The patient is nervous/anxious.   All other systems reviewed and are negative.    Physical Exam Updated Vital Signs BP 118/83 (BP Location: Left Arm)   Pulse 90   Temp 98.6 F (37 C) (Oral)  Resp 18   SpO2 100%   Physical Exam  Constitutional: She is oriented to person, place, and time. She appears well-developed and well-nourished. No distress.  HENT:  Head: Normocephalic and atraumatic.  Eyes: Conjunctivae are normal.  Neck: Neck supple.  No bruit, no palpable goiters  Cardiovascular: Normal rate, regular rhythm and normal heart sounds. Exam reveals no friction rub.  No murmur heard. Pulmonary/Chest:  Effort normal and breath sounds normal. No respiratory distress. She has no wheezes. She has no rales.  Abdominal: She exhibits no distension.  Musculoskeletal: She exhibits no edema.  Neurological: She is alert and oriented to person, place, and time. She exhibits normal muscle tone.  Skin: Skin is warm. Capillary refill takes less than 2 seconds.  Psychiatric: She exhibits a depressed mood.  Nursing note and vitals reviewed.    ED Treatments / Results  Labs (all labs ordered are listed, but only abnormal results are displayed) Labs Reviewed  COMPREHENSIVE METABOLIC PANEL - Abnormal; Notable for the following components:      Result Value   ALT 13 (*)    All other components within normal limits  ETHANOL  RAPID URINE DRUG SCREEN, HOSP PERFORMED  CBC WITH DIFFERENTIAL/PLATELET  TSH  I-STAT BETA HCG BLOOD, ED ([facility], [facility], [facility] ONLY)    EKG None  Radiology No results found.  Procedures Procedures (including critical care time)  Medications Ordered in ED Medications - No data to display   Initial Impression / Assessment and Plan / ED Course  I have reviewed the triage vital signs and the nursing notes.  Pertinent labs & imaging results that were available during my care of the patient were reviewed by me and considered in my medical decision making (see chart for details).     32 yo F here with sx of severe depression. While she has no overt SI, her depression is now interfering with her appetite, ability to work, etc. Will consult TTS for eval. Medically, pt is otherwise stable.  Final Clinical Impressions(s) / ED Diagnoses   Final diagnoses:  Moderate episode of recurrent major depressive disorder [facility])    ED Discharge Orders    None       Duffy Bruce, MD 08/10/17 1441

## 2017-08-10 NOTE — ED Triage Notes (Addendum)
Patient here from home with complaints of depression. States that it started last December and has gotten worse over the past 3 weeks. Loss of appetite. States that she take klonopin for anxiety. When asked what has caused the increased in depression, the patient states "life". Denies SI/HI.

## 2017-08-10 NOTE — ED Notes (Signed)
Bed: [facility] Expected date:  Expected time:  Means of arrival:  Comments: 

## 2017-10-17 DIAGNOSIS — E042 Nontoxic multinodular goiter: Secondary | ICD-10-CM | POA: Diagnosis not present

## 2017-12-30 DIAGNOSIS — Z029 Encounter for administrative examinations, unspecified: Secondary | ICD-10-CM | POA: Diagnosis not present

## 2017-12-30 DIAGNOSIS — N760 Acute vaginitis: Secondary | ICD-10-CM | POA: Diagnosis not present

## 2017-12-30 DIAGNOSIS — Z111 Encounter for screening for respiratory tuberculosis: Secondary | ICD-10-CM | POA: Diagnosis not present

## 2017-12-30 DIAGNOSIS — Z23 Encounter for immunization: Secondary | ICD-10-CM | POA: Diagnosis not present

## 2018-04-15 DIAGNOSIS — L298 Other pruritus: Secondary | ICD-10-CM | POA: Diagnosis not present

## 2018-04-15 DIAGNOSIS — N760 Acute vaginitis: Secondary | ICD-10-CM | POA: Diagnosis not present

## 2018-04-30 DIAGNOSIS — R197 Diarrhea, unspecified: Secondary | ICD-10-CM | POA: Diagnosis not present

## 2018-07-07 DIAGNOSIS — B373 Candidiasis of vulva and vagina: Secondary | ICD-10-CM | POA: Diagnosis not present

## 2018-07-07 DIAGNOSIS — Z8742 Personal history of other diseases of the female genital tract: Secondary | ICD-10-CM | POA: Diagnosis not present

## 2018-07-07 DIAGNOSIS — G43829 Menstrual migraine, not intractable, without status migrainosus: Secondary | ICD-10-CM | POA: Diagnosis not present

## 2018-10-20 ENCOUNTER — Other Ambulatory Visit (HOSPITAL_COMMUNITY)
Admission: RE | Admit: 2018-10-20 | Discharge: 2018-10-20 | Disposition: A | Discharge status: Home / Self Care | Payer: BC Managed Care – PPO | Source: Ambulatory Visit | Attending: Nurse Practitioner | Admitting: Nurse Practitioner

## 2018-10-20 ENCOUNTER — Other Ambulatory Visit: Payer: Self-pay | Admitting: Nurse Practitioner

## 2018-10-20 DIAGNOSIS — Z8742 Personal history of other diseases of the female genital tract: Secondary | ICD-10-CM | POA: Diagnosis not present

## 2018-10-20 DIAGNOSIS — Z01419 Encounter for gynecological examination (general) (routine) without abnormal findings: Secondary | ICD-10-CM | POA: Diagnosis not present

## 2018-10-23 LAB — CYTOLOGY - PAP
Diagnosis: NEGATIVE
HPV: NOT DETECTED

## 2018-12-02 DIAGNOSIS — N898 Other specified noninflammatory disorders of vagina: Secondary | ICD-10-CM | POA: Diagnosis not present

## 2018-12-02 DIAGNOSIS — Z113 Encounter for screening for infections with a predominantly sexual mode of transmission: Secondary | ICD-10-CM | POA: Diagnosis not present

## 2018-12-02 DIAGNOSIS — N920 Excessive and frequent menstruation with regular cycle: Secondary | ICD-10-CM | POA: Diagnosis not present

## 2018-12-09 ENCOUNTER — Other Ambulatory Visit: Payer: Self-pay

## 2018-12-09 ENCOUNTER — Ambulatory Visit (HOSPITAL_COMMUNITY)
Admission: EM | Admit: 2018-12-09 | Discharge: 2018-12-09 | Disposition: A | Discharge status: Home / Self Care | Payer: BC Managed Care – PPO | Attending: Family Medicine | Admitting: Family Medicine

## 2018-12-09 ENCOUNTER — Encounter (HOSPITAL_COMMUNITY): Payer: Self-pay

## 2018-12-09 DIAGNOSIS — Z0189 Encounter for other specified special examinations: Secondary | ICD-10-CM | POA: Insufficient documentation

## 2018-12-09 DIAGNOSIS — Z20828 Contact with and (suspected) exposure to other viral communicable diseases: Secondary | ICD-10-CM | POA: Diagnosis not present

## 2018-12-09 DIAGNOSIS — Z1159 Encounter for screening for other viral diseases: Secondary | ICD-10-CM | POA: Diagnosis not present

## 2018-12-09 NOTE — ED Provider Notes (Signed)
[place]    CSN: [id number] Arrival date & time: 12/09/18  [id number]      History   Chief Complaint Chief Complaint  Patient presents with  . Covid test    HPI Rebecca Edwards is a 33 y.o. female.   Patient presents with request for COVID test.  She is asymptomatic but states her daughter has a fever and a sore throat.  Patient denies fever, chills, sore throat, cough, shortness of breath, abdominal pain, vomiting, diarrhea, rash, or other symptoms.  The history is provided by the patient.    Past Medical History:  Diagnosis Date  . Ovarian cyst   . Panic attacks   . Thyroid disease    Hyper    Patient Active Problem List   Diagnosis Date Noted  . Major depressive disorder, recurrent episode, moderate ([place]) 08/10/2017  . PALPITATIONS 08/24/2009  . HYPERTHYROIDISM 04/28/2009  . ANXIETY 04/28/2009  . WEIGHT LOSS, ABNORMAL 05/07/2007    Past Surgical History:  Procedure Laterality Date  . CESAREAN SECTION    . TUBAL LIGATION      OB History   No obstetric history on file.      Home Medications    Prior to Admission medications   Medication Sig Start Date End Date Taking? Authorizing Provider  cholecalciferol (VITAMIN D) 1000 units tablet Take 1,000 Units by mouth daily.    [provider]  citalopram (CELEXA) 10 MG tablet Take 1 tablet (10 mg total) by mouth daily. 08/10/17   Patrecia Pour, NP  clonazePAM (KLONOPIN) 0.5 MG tablet Take 0.5 mg by mouth daily.     [provider]  Multiple Vitamin (MULTIVITAMIN WITH MINERALS) TABS tablet Take 1 tablet by mouth daily.    [provider]  Omega-3 Fatty Acids (FISH OIL PO) Take 1 capsule by mouth daily.    [provider]    Family History History reviewed. No pertinent family history.  Social History Social History   Tobacco Use  . Smoking status: Never Smoker  . Smokeless tobacco: Never Used  Substance Use Topics  . Alcohol use: No  . Drug use: No      Allergies   Amoxicillin, Hydrocodone-acetaminophen, Propoxyphene n-acetaminophen, and Pseudoephedrine   Review of Systems Review of Systems  Constitutional: Negative for chills and fever.  HENT: Negative for congestion, ear pain, rhinorrhea and sore throat.   Eyes: Negative for pain and visual disturbance.  Respiratory: Negative for cough and shortness of breath.   Cardiovascular: Negative for chest pain and palpitations.  Gastrointestinal: Negative for abdominal pain, diarrhea and vomiting.  Genitourinary: Negative for dysuria and hematuria.  Musculoskeletal: Negative for arthralgias and back pain.  Skin: Negative for color change and rash.  Neurological: Negative for seizures and syncope.  All other systems reviewed and are negative.    Physical Exam Triage Vital Signs ED Triage Vitals  Enc Vitals Group     BP 12/09/18 1857 134/88     Pulse Rate 12/09/18 1857 77     Resp 12/09/18 1857 16     Temp 12/09/18 1857 98.5 F (36.9 C)     Temp Source 12/09/18 1857 Oral     SpO2 12/09/18 1857 100 %     Weight 12/09/18 1858 112 lb 9.6 oz (51.1 kg)     Height --      Head Circumference --      Peak Flow --      Pain Score 12/09/18 1857 0  Pain Loc --      Pain Edu? --      Excl. in [place]? --    No data found.  Updated Vital Signs BP 134/88 (BP Location: Right Arm)   Pulse 77   Temp 98.5 F (36.9 C) (Oral)   Resp 16   Wt 112 lb 9.6 oz (51.1 kg)   LMP 12/09/2018   SpO2 100%   BMI 20.59 kg/m   Visual Acuity Right Eye Distance:   Left Eye Distance:   Bilateral Distance:    Right Eye Near:   Left Eye Near:    Bilateral Near:     Physical Exam Vitals signs and nursing note reviewed.  Constitutional:      General: She is not in acute distress.    Appearance: She is well-developed.  HENT:     Head: Normocephalic and atraumatic.     Right Ear: Tympanic membrane normal.     Left Ear: Tympanic membrane normal.     Nose: Nose normal.     Mouth/Throat:     Mouth:  Mucous membranes are moist.     Pharynx: Oropharynx is clear.  Eyes:     Conjunctiva/sclera: Conjunctivae normal.  Neck:     Musculoskeletal: Neck supple.  Cardiovascular:     Rate and Rhythm: Normal rate and regular rhythm.     Heart sounds: No murmur.  Pulmonary:     Effort: Pulmonary effort is normal. No respiratory distress.     Breath sounds: Normal breath sounds.  Abdominal:     General: Bowel sounds are normal.     Palpations: Abdomen is soft.     Tenderness: There is no abdominal tenderness. There is no guarding or rebound.  Skin:    General: Skin is warm and dry.     Findings: No rash.  Neurological:     Mental Status: She is alert.      UC Treatments / Results  Labs (all labs ordered are listed, but only abnormal results are displayed) Labs Reviewed - No data to display  EKG   Radiology No results found.  Procedures Procedures (including critical care time)  Medications Ordered in UC Medications - No data to display  Initial Impression / Assessment and Plan / UC Course  I have reviewed the triage vital signs and the nursing notes.  Pertinent labs & imaging results that were available during my care of the patient were reviewed by me and considered in my medical decision making (see chart for details).    Patient requested COVID test.  COVID test performed here.  Instructed patient to self quarantine until her test results are back.  Instructed patient to go to the emergency department if she develops high fever, shortness of breath, severe diarrhea, or other concerning symptoms.  Patient agrees with plan of care.     Final Clinical Impressions(s) / UC Diagnoses   Final diagnoses:  Patient requested diagnostic testing     Discharge Instructions     Your COVID test is pending.  You should self quarantine until your test result is back and is negative.    Go to the emergency department if you develop shortness of breath, high fever, severe  diarrhea, or other concerning symptoms.       ED Prescriptions    None     Controlled Substance Prescriptions [place] Controlled Substance Registry consulted? Not Applicable   Sharion Balloon, NP 12/09/18 [id number]

## 2018-12-09 NOTE — ED Triage Notes (Signed)
Pt states her daughter is sick. Pt wants a Covid test.

## 2018-12-09 NOTE — Discharge Instructions (Signed)
Your COVID test is pending.  You should self quarantine until your test result is back and is negative.   ° °Go to the emergency department if you develop shortness of breath, high fever, severe diarrhea, or other concerning symptoms.   ° ° ° °

## 2018-12-11 ENCOUNTER — Encounter (HOSPITAL_COMMUNITY): Payer: Self-pay

## 2018-12-11 LAB — NOVEL CORONAVIRUS, NAA (HOSP ORDER, SEND-OUT TO REF LAB; TAT 18-24 HRS): SARS-CoV-2, NAA: NOT DETECTED

## 2018-12-14 ENCOUNTER — Telehealth (HOSPITAL_COMMUNITY): Payer: Self-pay | Admitting: Emergency Medicine

## 2018-12-14 NOTE — Telephone Encounter (Signed)
Mother called asking about childrens test results. Results reported as negative.

## 2019-06-05 ENCOUNTER — Ambulatory Visit: Payer: BC Managed Care – PPO | Attending: Internal Medicine

## 2019-06-16 DIAGNOSIS — N760 Acute vaginitis: Secondary | ICD-10-CM | POA: Diagnosis not present

## 2019-06-16 DIAGNOSIS — E559 Vitamin D deficiency, unspecified: Secondary | ICD-10-CM | POA: Diagnosis not present

## 2019-06-16 DIAGNOSIS — N920 Excessive and frequent menstruation with regular cycle: Secondary | ICD-10-CM | POA: Diagnosis not present

## 2019-10-21 DIAGNOSIS — E042 Nontoxic multinodular goiter: Secondary | ICD-10-CM | POA: Diagnosis not present

## 2019-12-14 ENCOUNTER — Encounter (HOSPITAL_COMMUNITY): Payer: Self-pay

## 2019-12-14 ENCOUNTER — Ambulatory Visit (HOSPITAL_COMMUNITY)
Admission: EM | Admit: 2019-12-14 | Discharge: 2019-12-14 | Disposition: A | Discharge status: Home / Self Care | Payer: BC Managed Care – PPO | Attending: Internal Medicine | Admitting: Internal Medicine

## 2019-12-14 ENCOUNTER — Other Ambulatory Visit: Payer: Self-pay

## 2019-12-14 DIAGNOSIS — Z20822 Contact with and (suspected) exposure to covid-19: Secondary | ICD-10-CM | POA: Diagnosis not present

## 2019-12-14 LAB — SARS CORONAVIRUS 2 (TAT 6-24 HRS): SARS Coronavirus 2: NEGATIVE

## 2019-12-14 NOTE — ED Triage Notes (Signed)
Pt presents for COVID testing. Pt denies fever, shortness of breath, cough, or any other symptoms.    

## 2019-12-20 ENCOUNTER — Encounter (HOSPITAL_COMMUNITY): Payer: Self-pay

## 2019-12-20 ENCOUNTER — Ambulatory Visit (HOSPITAL_COMMUNITY)
Admission: EM | Admit: 2019-12-20 | Discharge: 2019-12-20 | Disposition: A | Discharge status: Home / Self Care | Payer: BC Managed Care – PPO | Attending: Family Medicine | Admitting: Family Medicine

## 2019-12-20 ENCOUNTER — Other Ambulatory Visit: Payer: Self-pay

## 2019-12-20 DIAGNOSIS — F419 Anxiety disorder, unspecified: Secondary | ICD-10-CM | POA: Diagnosis not present

## 2019-12-20 DIAGNOSIS — Z79899 Other long term (current) drug therapy: Secondary | ICD-10-CM | POA: Insufficient documentation

## 2019-12-20 DIAGNOSIS — Z885 Allergy status to narcotic agent status: Secondary | ICD-10-CM | POA: Diagnosis not present

## 2019-12-20 DIAGNOSIS — U071 COVID-19: Secondary | ICD-10-CM | POA: Insufficient documentation

## 2019-12-20 DIAGNOSIS — R05 Cough: Secondary | ICD-10-CM | POA: Diagnosis present

## 2019-12-20 DIAGNOSIS — B349 Viral infection, unspecified: Secondary | ICD-10-CM | POA: Diagnosis not present

## 2019-12-20 DIAGNOSIS — Z880 Allergy status to penicillin: Secondary | ICD-10-CM | POA: Diagnosis not present

## 2019-12-20 HISTORY — DX: Thyrotoxicosis, unspecified without thyrotoxic crisis or storm: E05.90

## 2019-12-20 LAB — SARS CORONAVIRUS 2 (TAT 6-24 HRS): SARS Coronavirus 2: POSITIVE — AB

## 2019-12-20 NOTE — ED Triage Notes (Signed)
Pt c/o non-productive cough, congestion, HA, runny nose, fever, boyd aches, bilateral ear pain, abdominal cramping, nausea onset Thursday. Pt reports she had positive home COVID test on Friday and pt's daughter also tested positive for COVID. Pt reports Tmax of 102 on Saturday despite tylenol q 4hours. Denies sore throat, SOB, v/d, loss of taste/smell.  Last took tylenol this morning at 0700. No other OTC meds for symptoms.

## 2019-12-20 NOTE — ED Provider Notes (Signed)
[place]    CSN: [id number] Arrival date & time: 12/20/19  0802      History   Chief Complaint Chief Complaint  Patient presents with  . Cough  . Nasal Congestion  . Fever    HPI Rebecca Edwards is a 34 y.o. female.   34 year old female presents today with nonproductive cough, congestion, headache, runny nose, fever, body aches, ear pain, abdominal cramping and nausea onset Thursday.  Had positive Covid test at home on Friday.  Patient daughter also tested positive for Covid.  Fever on Saturday 102.  Taking Tylenol for her symptoms.     Past Medical History:  Diagnosis Date  . Hyperthyroidism   . Ovarian cyst   . Panic attacks   . Thyroid disease    Hyper    Patient Active Problem List   Diagnosis Date Noted  . Major depressive disorder, recurrent episode, moderate ([place]) 08/10/2017  . PALPITATIONS 08/24/2009  . HYPERTHYROIDISM 04/28/2009  . ANXIETY 04/28/2009  . WEIGHT LOSS, ABNORMAL 05/07/2007    Past Surgical History:  Procedure Laterality Date  . CESAREAN SECTION    . TUBAL LIGATION      OB History   No obstetric history on file.      Home Medications    Prior to Admission medications   Medication Sig Start Date End Date Taking? Authorizing Provider  cholecalciferol (VITAMIN D) 1000 units tablet Take 1,000 Units by mouth daily.   Yes [provider]  clonazePAM (KLONOPIN) 0.5 MG tablet Take 0.5 mg by mouth daily.    Yes [provider]  Multiple Vitamin (MULTIVITAMIN WITH MINERALS) TABS tablet Take 1 tablet by mouth daily.   Yes [provider]  ARIPiprazole (ABILIFY) 2 MG tablet Take 2 mg by mouth daily. 10/14/19   [provider]  citalopram (CELEXA) 10 MG tablet Take 1 tablet (10 mg total) by mouth daily. 08/10/17   Patrecia Pour, NP  Omega-3 Fatty Acids (FISH OIL PO) Take 1 capsule by mouth daily.    [provider]    Family History Family History  Problem Relation Age of Onset  .  Healthy Mother   . Healthy Father     Social History Social History   Tobacco Use  . Smoking status: Never Smoker  . Smokeless tobacco: Never Used  Vaping Use  . Vaping Use: Never used  Substance Use Topics  . Alcohol use: No  . Drug use: No     Allergies   Amoxicillin, Hydrocodone-acetaminophen, Hydrocodone-acetaminophen, Propoxyphene, Propoxyphene n-acetaminophen, and Pseudoephedrine   Review of Systems Review of Systems   Physical Exam Triage Vital Signs ED Triage Vitals  Enc Vitals Group     BP 12/20/19 0820 115/80     Pulse Rate 12/20/19 0820 97     Resp 12/20/19 0820 18     Temp 12/20/19 0820 98.8 F (37.1 C)     Temp Source 12/20/19 0820 Oral     SpO2 12/20/19 0820 100 %     Weight --      Height --      Head Circumference --      Peak Flow --      Pain Score 12/20/19 0818 4     Pain Loc --      Pain Edu? --      Excl. in [place]? --    No data found.  Updated Vital Signs BP 115/80 (BP Location: Left Arm)   Pulse 97   Temp  98.8 F (37.1 C) (Oral)   Resp 18   LMP 12/10/2019   SpO2 100%   Visual Acuity Right Eye Distance:   Left Eye Distance:   Bilateral Distance:    Right Eye Near:   Left Eye Near:    Bilateral Near:     Physical Exam Vitals and nursing note reviewed.  Constitutional:      General: She is not in acute distress.    Appearance: Normal appearance. She is not ill-appearing, toxic-appearing or diaphoretic.  HENT:     Head: Normocephalic.     Ears:     Comments: Bilateral ear effusions    Nose: Nose normal.     Mouth/Throat:     Pharynx: Oropharynx is clear.  Eyes:     Conjunctiva/sclera: Conjunctivae normal.  Cardiovascular:     Rate and Rhythm: Normal rate and regular rhythm.  Pulmonary:     Effort: Pulmonary effort is normal.     Breath sounds: Normal breath sounds.  Musculoskeletal:        General: Normal range of motion.     Cervical back: Normal range of motion.  Skin:    General: Skin is warm and dry.      Findings: No rash.  Neurological:     Mental Status: She is alert.  Psychiatric:        Mood and Affect: Mood normal.      UC Treatments / Results  Labs (all labs ordered are listed, but only abnormal results are displayed) Labs Reviewed  SARS CORONAVIRUS 2 (TAT 6-24 HRS)    EKG   Radiology No results found.  Procedures Procedures (including critical care time)  Medications Ordered in UC Medications - No data to display  Initial Impression / Assessment and Plan / UC Course  I have reviewed the triage vital signs and the nursing notes.  Pertinent labs & imaging results that were available during my care of the patient were reviewed by me and considered in my medical decision making (see chart for details).     Viral illness Covid swab pending.  Most likely Covid based on home test positive.  Recommend quarantine, work note given. Medicines as needed Follow up as needed for continued or worsening symptoms  Final Clinical Impressions(s) / UC Diagnoses   Final diagnoses:  Viral illness     Discharge Instructions     We have tested you for COVID  Go home and quarantine until we get our results.  Work note given You can take OTC medications as needed. Flonase and zyrtec  Vitamin C 500 twice a day Zinc 50 mg daily Vitamin 5000 daily.      ED Prescriptions    None     PDMP not reviewed this encounter.   Orvan July, NP 12/20/19 [phone]

## 2019-12-20 NOTE — Discharge Instructions (Addendum)
We have tested you for COVID  Go home and quarantine until we get our results.  Work note given You can take OTC medications as needed. Flonase and zyrtec  Vitamin C 500 twice a day Zinc 50 mg daily Vitamin 5000 daily.

## 2019-12-21 ENCOUNTER — Telehealth (HOSPITAL_COMMUNITY): Payer: Self-pay | Admitting: Nurse Practitioner

## 2019-12-21 NOTE — Telephone Encounter (Signed)
Called to Discuss with patient about Covid symptoms and the use of the monoclonal antibody infusion for those with mild to moderate Covid symptoms and at a high risk of hospitalization.     Pt is qualified for this infusion due to co-morbid conditions and/or a member of an at-risk group.     Patient Active Problem List   Diagnosis Date Noted  . Major depressive disorder, recurrent episode, moderate ([place]) 08/10/2017  . PALPITATIONS 08/24/2009  . HYPERTHYROIDISM 04/28/2009  . ANXIETY 04/28/2009  . WEIGHT LOSS, ABNORMAL 05/07/2007    Patient would like to consider information and call back with decision at this time. Symptoms tier reviewed as well as criteria for ending isolation. Preventative practices reviewed. Patient verbalized understanding.    Patient advised to call back if he/she decides that he/she does want to get infusion. Callback number to the infusion center given. Patient advised to go to Urgent care or ED with severe symptoms.

## 2019-12-25 ENCOUNTER — Telehealth: Payer: Self-pay | Admitting: Unknown Physician Specialty

## 2019-12-25 NOTE — Telephone Encounter (Signed)
Called to Discuss with patient about Covid symptoms and the use of the monoclonal antibody infusion for those with mild to moderate Covid symptoms and at a high risk of hospitalization.     Pt appears to qualify for this infusion due to co-morbid conditions and/or a member of an at-risk group in accordance with the FDA Emergency Use Authorization.    Unable to reach pt    

## 2020-01-02 DIAGNOSIS — J329 Chronic sinusitis, unspecified: Secondary | ICD-10-CM | POA: Diagnosis not present

## 2020-01-02 DIAGNOSIS — J40 Bronchitis, not specified as acute or chronic: Secondary | ICD-10-CM | POA: Diagnosis not present

## 2020-08-29 ENCOUNTER — Emergency Department (HOSPITAL_COMMUNITY): Payer: Medicaid Other

## 2020-08-29 ENCOUNTER — Encounter (HOSPITAL_COMMUNITY): Payer: Self-pay | Admitting: Emergency Medicine

## 2020-08-29 ENCOUNTER — Emergency Department (HOSPITAL_COMMUNITY)
Admission: EM | Admit: 2020-08-29 | Discharge: 2020-08-30 | Disposition: A | Discharge status: Home / Self Care | Payer: Medicaid Other | Attending: Emergency Medicine | Admitting: Emergency Medicine

## 2020-08-29 ENCOUNTER — Other Ambulatory Visit: Payer: Self-pay

## 2020-08-29 DIAGNOSIS — Z20822 Contact with and (suspected) exposure to covid-19: Secondary | ICD-10-CM | POA: Diagnosis not present

## 2020-08-29 DIAGNOSIS — R110 Nausea: Secondary | ICD-10-CM | POA: Diagnosis not present

## 2020-08-29 DIAGNOSIS — R42 Dizziness and giddiness: Secondary | ICD-10-CM | POA: Diagnosis present

## 2020-08-29 NOTE — ED Triage Notes (Signed)
Patient reports central chest pain with dizziness and nausea this week , seen at [facility] last Sunday diagnosed with URI , no fever or chills .

## 2020-08-29 NOTE — ED Provider Notes (Signed)
Emergency Medicine Provider Triage Evaluation Note  Rebecca Edwards , a 35 y.o. female  was evaluated in triage.  Pt complains of chest tightness and dizziness.  Was seen at [facility] clinic for URI.  Complains of ear fullness.  States that the dizziness is a sensation of passing out rather than room spinning.  Has had productive cough.  Daughter has COVID.  Review of Systems  Positive: Dizziness, chest tightness, cough Negative: Fever  Physical Exam  There were no vitals taken for this visit. Gen:   Awake, no distress   Resp:  Normal effort  MSK:   Moves extremities without difficulty  Other:  Lungs sound clear, TMs with serous effusions bilaterally, without erythema.  Medical Decision Making  Medically screening exam initiated at 10:56 PM.  Appropriate orders placed.  JAANVI FIZER was informed that the remainder of the evaluation will be completed by another provider, this initial triage assessment does not replace that evaluation, and the importance of remaining in the ED until their evaluation is complete.     Montine Circle, PA-C 08/29/20 2307    Valarie Merino, MD 09/06/20 [phone]

## 2020-08-30 LAB — BASIC METABOLIC PANEL
Anion gap: 9 (ref 5–15)
BUN: 6 mg/dL (ref 6–20)
CO2: 23 mmol/L (ref 22–32)
Calcium: 8.9 mg/dL (ref 8.9–10.3)
Chloride: 105 mmol/L (ref 98–111)
Creatinine, Ser: 0.85 mg/dL (ref 0.44–1.00)
GFR, Estimated: >60 mL/min (ref >60)
Glucose, Bld: 118 mg/dL — ABNORMAL HIGH (ref 70–99)
Potassium: 3.5 mmol/L (ref 3.5–5.1)
Sodium: 137 mmol/L (ref 135–145)

## 2020-08-30 LAB — CBC WITH DIFFERENTIAL/PLATELET
Abs Immature Granulocytes: 0.04 10*3/uL (ref 0.00–0.07)
Basophils Absolute: 0.1 10*3/uL (ref 0.0–0.1)
Basophils Relative: 1 %
Eosinophils Absolute: 0.1 10*3/uL (ref 0.0–0.5)
Eosinophils Relative: 1 %
HCT: 38.1 % (ref 36.0–46.0)
Hemoglobin: 12.2 g/dL (ref 12.0–15.0)
Immature Granulocytes: 0 %
Lymphocytes Relative: 18 %
Lymphs Abs: 1.9 10*3/uL (ref 0.7–4.0)
MCH: 29 pg (ref 26.0–34.0)
MCHC: 32 g/dL (ref 30.0–36.0)
MCV: 90.5 fL (ref 80.0–100.0)
Monocytes Absolute: 0.8 10*3/uL (ref 0.1–1.0)
Monocytes Relative: 8 %
Neutro Abs: 7.5 10*3/uL (ref 1.7–7.7)
Neutrophils Relative %: 72 %
Platelets: 320 10*3/uL (ref 150–400)
RBC: 4.21 MIL/uL (ref 3.87–5.11)
RDW: 13.2 % (ref 11.5–15.5)
WBC: 10.3 10*3/uL (ref 4.0–10.5)
nRBC: 0 % (ref 0.0–0.2)

## 2020-08-30 LAB — I-STAT BETA HCG BLOOD, ED (MC, WL, AP ONLY): I-stat hCG, quantitative: <5 m[IU]/mL (ref <5)

## 2020-08-30 LAB — RESP PANEL BY RT-PCR (FLU A&B, COVID) ARPGX2
Influenza A by PCR: NEGATIVE
Influenza B by PCR: NEGATIVE
SARS Coronavirus 2 by RT PCR: NEGATIVE

## 2020-08-30 MED ORDER — ONDANSETRON 4 MG PO TBDP
4.0000 mg | ORAL_TABLET | Freq: Once | ORAL | Status: AC
  Administered 2020-08-30: 4 mg via ORAL
  Filled 2020-08-30: qty 1

## 2020-08-30 MED ORDER — SODIUM CHLORIDE 0.9 % IV BOLUS: 1000.0000 mL | Freq: Once | INTRAVENOUS | Status: DC

## 2020-08-30 NOTE — ED Provider Notes (Signed)
[facility] EMERGENCY DEPARTMENT Provider Note   CSN: [id number] Arrival date & time: 08/29/20  2251     History Chief Complaint  Patient presents with  . Chest Pain / Dizzy/Nausea    Rebecca Edwards is a 35 y.o. female.  Patient to ED with symptoms of dizziness she describes as lightheadedness, denies room-spinning, worse when she lies down, present when she sits up or stands but no worse. She reports nausea without vomiting when lightheadedness is intense. She has a global headache that is mild. She states her daughter has been diagnosed with COVID by home testing 5 days ago. Her symptoms started shortly after this. No significant fever until yesterday when she had a temperature of 101. She was seen 2 days ago at an Urgent Care and reports negative COVID and flu testing. Symptoms have continued to worsen.   The history is provided by the patient. No language interpreter was used.       Past Medical History:  Diagnosis Date  . Hyperthyroidism   . Ovarian cyst   . Panic attacks   . Thyroid disease    Hyper    Patient Active Problem List   Diagnosis Date Noted  . Major depressive disorder, recurrent episode, moderate ([place]) 08/10/2017  . PALPITATIONS 08/24/2009  . HYPERTHYROIDISM 04/28/2009  . ANXIETY 04/28/2009  . WEIGHT LOSS, ABNORMAL 05/07/2007    Past Surgical History:  Procedure Laterality Date  . CESAREAN SECTION    . TUBAL LIGATION       OB History   No obstetric history on file.     Family History  Problem Relation Age of Onset  . Healthy Mother   . Healthy Father     Social History   Tobacco Use  . Smoking status: Never Smoker  . Smokeless tobacco: Never Used  Vaping Use  . Vaping Use: Never used  Substance Use Topics  . Alcohol use: No  . Drug use: No    Home Medications Prior to Admission medications   Medication Sig Start Date End Date Taking? Authorizing Provider  ARIPiprazole (ABILIFY) 2 MG tablet Take 2 mg by  mouth daily. 10/14/19   [provider]  cholecalciferol (VITAMIN D) 1000 units tablet Take 1,000 Units by mouth daily.    [provider]  citalopram (CELEXA) 10 MG tablet Take 1 tablet (10 mg total) by mouth daily. 08/10/17   Patrecia Pour, NP  clonazePAM (KLONOPIN) 0.5 MG tablet Take 0.5 mg by mouth daily.     [provider]  Multiple Vitamin (MULTIVITAMIN WITH MINERALS) TABS tablet Take 1 tablet by mouth daily.    [provider]  Omega-3 Fatty Acids (FISH OIL PO) Take 1 capsule by mouth daily.    [provider]    Allergies    Amoxicillin, Hydrocodone-acetaminophen, Hydrocodone-acetaminophen, Propoxyphene, Propoxyphene n-acetaminophen, and Pseudoephedrine  Review of Systems   Review of Systems  Constitutional: Positive for fatigue and fever.  HENT: Negative.   Respiratory: Positive for cough.   Cardiovascular: Negative.  Negative for chest pain.  Gastrointestinal: Positive for nausea. Negative for abdominal pain and vomiting.  Genitourinary: Negative for decreased urine volume and dysuria.  Musculoskeletal: Negative.   Skin: Negative.   Neurological: Positive for light-headedness and headaches.    Physical Exam Updated Vital Signs BP 123/83 (BP Location: Right Arm)   Pulse 79   Temp 98.6 F (37 C) (Oral)   Resp 14   Ht 5\' 1"  (1.549 m)   Wt 70  kg   LMP 08/22/2020   SpO2 100%   BMI 29.16 kg/m   Physical Exam Vitals and nursing note reviewed.  Constitutional:      Appearance: She is well-developed.  HENT:     Head: Normocephalic.  Cardiovascular:     Rate and Rhythm: Normal rate and regular rhythm.     Heart sounds: No murmur heard.   Pulmonary:     Effort: Pulmonary effort is normal.     Breath sounds: Normal breath sounds. No wheezing, rhonchi or rales.  Abdominal:     General: Bowel sounds are normal.     Palpations: Abdomen is soft.     Tenderness: There is no abdominal tenderness. There is no guarding or  rebound.  Musculoskeletal:        General: Normal range of motion.     Cervical back: Normal range of motion and neck supple.  Skin:    General: Skin is warm and dry.     Findings: No rash.  Neurological:     Mental Status: She is alert and oriented to person, place, and time.     Comments: No nystagmus. CN's 3-12 grossly intact. No strength or sensory deficits.      ED Results / Procedures / Treatments   Labs (all labs ordered are listed, but only abnormal results are displayed) Labs Reviewed  RESP PANEL BY RT-PCR (FLU A&B, COVID) ARPGX2  BASIC METABOLIC PANEL  CBC WITH DIFFERENTIAL/PLATELET  I-STAT BETA HCG BLOOD, ED ([facility], [facility], [facility] ONLY)    EKG EKG Interpretation  Date/Time:  Wednesday Aug 30 2020 03:02:59 EDT Ventricular Rate:  80 PR Interval:  139 QRS Duration: 78 QT Interval:  374 QTC Calculation: 432 R Axis:   74 Text Interpretation: Sinus rhythm Confirmed by Ripley Fraise [phone]) on 08/30/2020 3:12:53 AM   Radiology DG Chest 2 View  Result Date: 08/29/2020 CLINICAL DATA:  Cough EXAM: CHEST - 2 VIEW COMPARISON:  None. FINDINGS: The heart size and mediastinal contours are within normal limits. Both lungs are clear. The visualized skeletal structures are unremarkable. IMPRESSION: No active cardiopulmonary disease. Electronically Signed   By: Fidela Salisbury MD   On: 08/29/2020 23:18    Procedures Procedures   Medications Ordered in ED Medications  sodium chloride 0.9 % bolus 1,000 mL (has no administration in time range)    ED Course  I have reviewed the triage vital signs and the nursing notes.  Pertinent labs & imaging results that were available during my care of the patient were reviewed by me and considered in my medical decision making (see chart for details).    MDM Rules/Calculators/A&P                          Patient to ED with ss/sxs as per HPI.   She is overall well appearing. Daughter with similar symptoms, including dizziness, and diagnosed  with COVID by home test last week. The patient has had negative testing herself. No syncope.   Labs are normal, afebrile. Repeat RVP testing negative. Labs otherwise unremarkable.   She is given Zofran with improvement in nausea. She is drinking without difficulty. On repeat exam, she is romberg negative and ambulates without imbalance.   She can be discharged home with PCP follow up. Return precautions discussed.  Final Clinical Impression(s) / ED Diagnoses Final diagnoses:  None   1. Dizziness 2. Nausea   Rx / DC Orders ED Discharge Orders    None  Charlann Lange, PA-C 08/30/20 2947    Ripley Fraise, MD 08/30/20 [phone]

## 2020-08-30 NOTE — Discharge Instructions (Signed)
Take Zofran for nausea. Push fluids to avoid dehydration. Take Tylenol if any fever develops.   Follow up with your doctor for recheck if symptoms persist.  If you develop high fever, severe pain or new/worsening symptoms of dizziness and/or nausea, return to the ED at any time.

## 2020-08-31 ENCOUNTER — Telehealth: Payer: Self-pay | Admitting: *Deleted

## 2020-08-31 NOTE — Telephone Encounter (Signed)
Transition Care Management Unsuccessful Follow-up Telephone Call  Date of discharge and from where:  08/30/2020 [facility]  Attempts:  1st Attempt  Reason for unsuccessful TCM follow-up call:  Left voice message

## 2020-09-01 NOTE — Telephone Encounter (Signed)
Transition Care Management Follow-up Telephone Call  Date of discharge and from where: 08/29/2020 from [facility]  How have you been since you were released from the hospital? Pt stated that her symptoms have improved. 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   Any questions or concerns? No  Items Reviewed:  Did the pt receive and understand the discharge instructions provided? Yes   Medications obtained and verified? Yes   Other? No   Any new allergies since your discharge? No   Dietary orders reviewed? n/a  Do you have support at home? Yes   Functional Questionnaire: (I = Independent and D = Dependent) ADLs: I  Bathing/Dressing- I  Meal Prep- I  Eating- I  Maintaining continence- I  Transferring/Ambulation- I  Managing Meds- I   Follow up appointments reviewed:   PCP Hospital f/u appt confirmed? No  .  [facility] f/u appt confirmed? No    Are transportation arrangements needed? No   If their condition worsens, is the pt aware to call PCP or go to the Emergency Dept.? Yes  Was the patient provided with contact information for the PCP's office or ED? Yes  Was to pt encouraged to call back with questions or concerns? Yes

## 2021-10-30 ENCOUNTER — Other Ambulatory Visit: Payer: Self-pay | Admitting: Internal Medicine

## 2021-10-30 DIAGNOSIS — E042 Nontoxic multinodular goiter: Secondary | ICD-10-CM

## 2021-11-02 ENCOUNTER — Ambulatory Visit
Admission: RE | Admit: 2021-11-02 | Discharge: 2021-11-02 | Disposition: A | Discharge status: Home / Self Care | Payer: BC Managed Care – PPO | Source: Ambulatory Visit | Attending: Internal Medicine | Admitting: Internal Medicine

## 2021-11-02 DIAGNOSIS — E042 Nontoxic multinodular goiter: Secondary | ICD-10-CM

## 2022-02-07 ENCOUNTER — Ambulatory Visit: Payer: Self-pay | Admitting: Surgery

## 2022-03-14 ENCOUNTER — Encounter (HOSPITAL_COMMUNITY): Payer: Self-pay | Admitting: Surgery

## 2022-03-14 DIAGNOSIS — E041 Nontoxic single thyroid nodule: Secondary | ICD-10-CM | POA: Diagnosis present

## 2022-03-14 NOTE — H&P (Signed)
REFERRING PHYSICIAN: Lilian Coma, MD  PROVIDER: Ayauna Mcnay Charlotta Newton, MD   Chief Complaint: New Consultation (Enlarging right thyroid nodule)  History of Present Illness:  Patient is referred by Dr. Jonathon Jordan for surgical evaluation and management of an enlarging right thyroid nodule with mild compressive symptoms. Patient has seen Dr. Delrae Rend in consultation from endocrinology. Patient has had a known history of thyroid disease dating back 7 to 8 years. She has had a dominant nodule in the right thyroid lobe which is gradually been enlarging. She has had 2 previous biopsies, both of which have been benign. Patient underwent ultrasound on November 02, 2021. This showed an interval enlargement in the right mid to inferior thyroid nodule from 2.8 cm up to its current level of 4.3 cm. There is a small nodule in the upper pole of the left thyroid lobe which measures 9 mm. Patient has begun to notice a globus sensation with the mass in the right thyroid lobe. She denies any significant dysphagia. She denies any dyspnea. Patient has had no prior head or neck surgery. She has never been on thyroid medication. Her most recent TSH level is normal at 0.84. There is a family history of thyroid disease in multiple family members on her mother side. Multiple family members take thyroid medication. There is no family history of thyroid cancer. Patient is a Pharmacist, hospital for [employer].  Review of Systems: A complete review of systems was obtained from the patient. I have reviewed this information and discussed as appropriate with the patient. See HPI as well for other ROS.  Review of Systems  Constitutional: Negative.  HENT: Negative.  Globus sensation  Eyes: Negative.  Respiratory: Negative.  Cardiovascular: Negative.  Gastrointestinal: Negative.  Genitourinary: Negative.  Musculoskeletal: Negative.  Skin: Negative.  Neurological: Negative.  Endo/Heme/Allergies: Negative.   Psychiatric/Behavioral: Negative.    Medical History: Past Medical History:  Diagnosis Date  Anxiety  Thyroid disease   Patient Active Problem List  Diagnosis  Right thyroid nodule   Past Surgical History:  Procedure Laterality Date  REPEAT CESAREAN SECTION    Allergies  Allergen Reactions  Amoxicillin Itching  Unknown reaction  Has patient had a PCN reaction causing immediate rash, facial/tongue/throat swelling, SOB or lightheadedness with hypotension: Unknown Has patient had a PCN reaction causing severe rash involving mucus membranes or skin necrosis: Unknown Has patient had a PCN reaction that required hospitalization: Unknown Has patient had a PCN reaction occurring within the last 10 years: No If all of the above answers are "NO", then may proceed with Cephalosporin use.  Sudafed [Pseudoephedrine Hcl] Palpitations  Vicodin [Hydrocodone-Acetaminophen] Itching   Current Outpatient Medications on File Prior to Visit  Medication Sig Dispense Refill  venlafaxine (EFFEXOR-XR) 37.5 MG XR capsule  ARIPiprazole (ABILIFY) 2 MG tablet Take 2 mg by mouth once daily  cholecalciferol (VITAMIN D3) 1000 unit tablet Take by mouth  clonazePAM (KLONOPIN) 0.5 MG tablet TAKE ONE-HALF TO ONE TABLET BY MOUTH THREE TIMES DAILY AS NEEDED FOR 30 DAYS   No current facility-administered medications on file prior to visit.   History reviewed. No pertinent family history.   Social History   Tobacco Use  Smoking Status Never  Smokeless Tobacco Never    Social History   Socioeconomic History  Marital status: Single  Tobacco Use  Smoking status: Never  Smokeless tobacco: Never  Substance and Sexual Activity  Alcohol use: Not Currently  Drug use: Never   Objective:   Vitals:  BP: 100/80  Pulse: (!) 123  Temp: 36.7 C (98 F)  SpO2: 98%  Weight: 64.9 kg (143 lb)  Height: 157.5 cm ('5\' 2"'$ )   Body mass index is 26.16 kg/m.  Physical Exam   GENERAL APPEARANCE Comfortable,  no acute issues Development: normal Gross deformities: none  SKIN Rash, lesions, ulcers: none Induration, erythema: none Nodules: none palpable  EYES Conjunctiva and lids: normal Pupils: equal and reactive  EARS, NOSE, MOUTH, THROAT External ears: no lesion or deformity External nose: no lesion or deformity Hearing: grossly normal  NECK Symmetric: no Trachea: midline Thyroid: There is a dominant smooth relatively firm nodular mass in the right thyroid lobe measuring at least 4 cm in greatest dimension. It is mobile with swallowing and nontender. Left thyroid lobe is without palpable abnormality. There is no associated lymphadenopathy.  CHEST Respiratory effort: normal Retraction or accessory muscle use: no Breath sounds: normal bilaterally Rales, rhonchi, wheeze: none  CARDIOVASCULAR Auscultation: regular rhythm, normal rate Murmurs: none Pulses: radial pulse 2+ palpable Lower extremity edema: none  ABDOMEN Not assessed  GENITOURINARY/RECTAL Not assessed  MUSCULOSKELETAL Station and gait: normal Digits and nails: no clubbing or cyanosis Muscle strength: grossly normal all extremities Range of motion: grossly normal all extremities Deformity: none  LYMPHATIC Cervical: none palpable Supraclavicular: none palpable  PSYCHIATRIC Oriented to person, place, and time: yes Mood and affect: normal for situation Judgment and insight: appropriate for situation   Assessment and Plan:   Right thyroid nodule  Patient is referred by her primary care physician for surgical evaluation and management of an enlarging right thyroid nodule.  Patient provided with a copy of "The Thyroid Book: Medical and Surgical Treatment of Thyroid Problems", published by [employer], 16 pages. Book reviewed and explained to patient during visit today.  Today we reviewed her clinical history, her most recent ultrasound results, and her laboratory studies. We discussed options for management.  We discussed indications for thyroid surgery. We discussed proceeding with right thyroid lobectomy versus total thyroidectomy. We discussed the risk and benefits of the procedure including the risk of recurrent laryngeal nerve injury and injury to parathyroid glands. We discussed the size and location of the surgical incision. We discussed the hospital stay to be anticipated. We discussed her postoperative recovery and return to work and activities. We discussed the potential need for additional surgery. We discussed the potential need for radioactive iodine treatment. After discussion the patient would like to proceed with right thyroid lobectomy. We will make arrangements for this procedure at a time convenient for the patient in the near future.   Armandina Gemma, MD [facility] [place] practice Office: [phone]

## 2022-03-15 NOTE — Patient Instructions (Signed)
SURGICAL WAITING ROOM VISITATION Patients having surgery or a procedure may have no more than 2 support people in the waiting area - these visitors may rotate.   Children under the age of 106 must have an adult with them who is not the patient. If the patient needs to stay at the hospital during part of their recovery, the visitor guidelines for inpatient rooms apply. Pre-op nurse will coordinate an appropriate time for 1 support person to accompany patient in pre-op.  This support person may not rotate.    Please refer to the [facility] website for the visitor guidelines for Inpatients (after your surgery is over and you are in a regular room).       Your procedure is scheduled on: Monday, Dec. 18, 2023   Report to [facility] Main Entrance    Report to admitting at 5:15 AM   Call this number if you have problems the morning of surgery [phone]   Do not eat food :After Midnight.   After Midnight you may have the following liquids until __4:30____ AM DAY OF SURGERY  Water Non-Citrus Juices (without pulp, NO RED) Carbonated Beverages Black Coffee (NO MILK/CREAM OR CREAMERS, sugar ok)  Clear Tea (NO MILK/CREAM OR CREAMERS, sugar ok) regular and decaf                             Plain Jell-O (NO RED)                                           Fruit ices (not with fruit pulp, NO RED)                                     Popsicles (NO RED)                                                               Sports drinks like Gatorade (NO RED)  FOLLOW BOWEL PREP AND ANY ADDITIONAL PRE OP INSTRUCTIONS YOU RECEIVED FROM YOUR SURGEON'S OFFICE!!!     Oral Hygiene is also important to reduce your risk of infection.                                    Remember - BRUSH YOUR TEETH THE MORNING OF SURGERY WITH YOUR REGULAR TOOTHPASTE  DENTURES WILL BE REMOVED PRIOR TO SURGERY PLEASE DO NOT APPLY "Poly grip" OR ADHESIVES!!!   Do NOT smoke after Midnight   Take these medicines the morning  of surgery with A SIP OF WATER: Venlafaxine             You may not have any metal on your body including hair pins, jewelry, and body piercing             Do not wear make-up, lotions, powders, perfumes/cologne, or deodorant  Do not wear nail polish including gel and S&S, artificial/acrylic nails, or any other type of covering on natural nails including finger and toenails. If  you have artificial nails, gel coating, etc. that needs to be removed by a nail salon please have this removed prior to surgery or surgery may need to be canceled/ delayed if the surgeon/ anesthesia feels like they are unable to be safely monitored.   Do not shave  48 hours prior to surgery.    Do not bring valuables to the hospital. [place].   Contacts, glasses, dentures or bridgework may not be worn into surgery.   Bring small overnight bag day of surgery.   DO NOT [place]. PHARMACY WILL DISPENSE MEDICATIONS LISTED ON YOUR MEDICATION LIST TO YOU DURING YOUR ADMISSION [place]!    Patients discharged on the day of surgery will not be allowed to drive home.  Someone NEEDS to stay with you for the first 24 hours after anesthesia.   Special Instructions: Bring a copy of your healthcare power of attorney and living will documents the day of surgery if you haven't scanned them before.              Please read over the following fact sheets you were given: IF [place] [phone]   If you received a COVID test during your pre-op visit  it is requested that you wear a mask when out in public, stay away from anyone that may not be feeling well and notify your surgeon if you develop symptoms. If you test positive for Covid or have been in contact with anyone that has tested positive in the last 10 days please notify you surgeon.    [place] - Preparing for Surgery Before  surgery, you can play an important role.  Because skin is not sterile, your skin needs to be as free of germs as possible.  You can reduce the number of germs on your skin by washing with CHG (chlorahexidine gluconate) soap before surgery.  CHG is an antiseptic cleaner which kills germs and bonds with the skin to continue killing germs even after washing. Please DO NOT use if you have an allergy to CHG or antibacterial soaps.  If your skin becomes reddened/irritated stop using the CHG and inform your nurse when you arrive at Short Stay. Do not shave (including legs and underarms) for at least 48 hours prior to the first CHG shower.  You may shave your face/neck.  Please follow these instructions carefully:  1.  Shower with CHG Soap the night before surgery and the  morning of surgery.  2.  If you choose to wash your hair, wash your hair first as usual with your normal  shampoo.  3.  After you shampoo, rinse your hair and body thoroughly to remove the shampoo.                             4.  Use CHG as you would any other liquid soap.  You can apply chg directly to the skin and wash.  Gently with a scrungie or clean washcloth.  5.  Apply the CHG Soap to your body ONLY FROM THE NECK DOWN.   Do   not use on face/ open  Wound or open sores. Avoid contact with eyes, ears mouth and   genitals (private parts).                       Wash face,  Genitals (private parts) with your normal soap.             6.  Wash thoroughly, paying special attention to the area where your    surgery  will be performed.  7.  Thoroughly rinse your body with warm water from the neck down.  8.  DO NOT shower/wash with your normal soap after using and rinsing off the CHG Soap.                9.  Pat yourself dry with a clean towel.            10.  Wear clean pajamas.            11.  Place clean sheets on your bed the night of your first shower and do not  sleep with pets. Day of Surgery : Do not apply  any lotions/deodorants the morning of surgery.  Please wear clean clothes to the hospital/surgery center.  FAILURE TO FOLLOW THESE INSTRUCTIONS MAY RESULT IN THE CANCELLATION OF YOUR SURGERY  PATIENT SIGNATURE_________________________________  NURSE SIGNATURE__________________________________  ________________________________________________________________________

## 2022-03-18 ENCOUNTER — Encounter (HOSPITAL_COMMUNITY): Payer: Self-pay

## 2022-03-18 ENCOUNTER — Encounter (HOSPITAL_COMMUNITY)
Admission: RE | Admit: 2022-03-18 | Discharge: 2022-03-18 | Disposition: A | Discharge status: Home / Self Care | Payer: BC Managed Care – PPO | Source: Ambulatory Visit | Attending: Surgery | Admitting: Surgery

## 2022-03-18 ENCOUNTER — Other Ambulatory Visit: Payer: Self-pay

## 2022-03-18 DIAGNOSIS — Z01818 Encounter for other preprocedural examination: Secondary | ICD-10-CM | POA: Insufficient documentation

## 2022-03-18 HISTORY — DX: Palpitations: R00.2

## 2022-03-18 HISTORY — DX: Gastro-esophageal reflux disease without esophagitis: K21.9

## 2022-03-18 HISTORY — DX: Headache, unspecified: R51.9

## 2022-03-18 HISTORY — DX: Depression, unspecified: F32.A

## 2022-03-18 HISTORY — DX: Personal history of other diseases of the digestive system: Z87.19

## 2022-03-18 NOTE — Progress Notes (Addendum)
PCP - Candiss Norse Cardiologist - no  PPM/ICD -  Device Orders -  Rep Notified -   Chest x-ray -  EKG -  Stress Test -  ECHO -  Cardiac Cath -   Sleep Study -  CPAP -   Fasting Blood Sugar -  Checks Blood Sugar _____ times a day  Blood Thinner Instructions: Aspirin Instructions:  ERAS Protcol - PRE-SURGERY Ensure or G2-   COVID TEST-  COVID vaccine -  Activity-- Anesthesia review:   Patient denies shortness of breath, fever, cough and chest pain at PAT appointment   All instructions explained to the patient, with a verbal understanding of the material. Patient agrees to go over the instructions while at home for a better understanding. Patient also instructed to self quarantine after being tested for COVID-19. The opportunity to ask questions was provided.

## 2022-03-24 NOTE — Anesthesia Preprocedure Evaluation (Signed)
Anesthesia Evaluation  Patient identified by MRN, date of birth, ID band Patient awake    Reviewed: Allergy & Precautions, NPO status , Patient's Chart, lab work & pertinent test results  Airway Mallampati: I  TM Distance: >3 FB Neck ROM: Full    Dental no notable dental hx. (+) Teeth Intact, Dental Advisory Given   Pulmonary    Pulmonary exam normal breath sounds clear to auscultation       Cardiovascular Normal cardiovascular exam Rhythm:Regular Rate:Normal     Neuro/Psych  Headaches PSYCHIATRIC DISORDERS  Depression       GI/Hepatic   Endo/Other   Hyperthyroidism R Thyroid nodule  Renal/GU      Musculoskeletal   Abdominal   Peds  Hematology   Anesthesia Other Findings All: Amoxicillin, propoxyphene, Hydrocodone,  Reproductive/Obstetrics negative OB ROS                             Anesthesia Physical Anesthesia Plan  ASA: 1  Anesthesia Plan: General   Post-op Pain Management: Precedex, Tylenol PO (pre-op)* and Dilaudid IV   Induction: Intravenous  PONV Risk Score and Plan: 4 or greater and Treatment may vary due to age or medical condition, Ondansetron, Midazolam, Dexamethasone, Scopolamine patch - Pre-op, TIVA and Propofol infusion  Airway Management Planned: Oral ETT  Additional Equipment: None  Intra-op Plan:   Post-operative Plan: Extubation in OR  Informed Consent: I have reviewed the patients History and Physical, chart, labs and discussed the procedure including the risks, benefits and alternatives for the proposed anesthesia with the patient or authorized representative who has indicated his/her understanding and acceptance.     Dental advisory given  Plan Discussed with: CRNA, Anesthesiologist and Surgeon  Anesthesia Plan Comments: (GA w TIVA)       Anesthesia Quick Evaluation

## 2022-03-25 ENCOUNTER — Ambulatory Visit (HOSPITAL_COMMUNITY)
Admission: RE | Admit: 2022-03-25 | Discharge: 2022-03-26 | Disposition: A | Discharge status: Home / Self Care | Payer: BC Managed Care – PPO | Attending: Surgery | Admitting: Surgery

## 2022-03-25 ENCOUNTER — Ambulatory Visit (HOSPITAL_COMMUNITY): Payer: BC Managed Care – PPO | Admitting: Anesthesiology

## 2022-03-25 ENCOUNTER — Encounter (HOSPITAL_COMMUNITY): Payer: Self-pay | Admitting: Surgery

## 2022-03-25 ENCOUNTER — Encounter (HOSPITAL_COMMUNITY)
Admission: RE | Disposition: A | Discharge status: Home / Self Care | Payer: Self-pay | Source: Home / Self Care | Attending: Surgery

## 2022-03-25 ENCOUNTER — Other Ambulatory Visit: Payer: Self-pay

## 2022-03-25 DIAGNOSIS — E041 Nontoxic single thyroid nodule: Secondary | ICD-10-CM | POA: Diagnosis present

## 2022-03-25 DIAGNOSIS — E0590 Thyrotoxicosis, unspecified without thyrotoxic crisis or storm: Secondary | ICD-10-CM | POA: Insufficient documentation

## 2022-03-25 DIAGNOSIS — E042 Nontoxic multinodular goiter: Secondary | ICD-10-CM | POA: Insufficient documentation

## 2022-03-25 DIAGNOSIS — D34 Benign neoplasm of thyroid gland: Secondary | ICD-10-CM | POA: Diagnosis not present

## 2022-03-25 HISTORY — DX: Other complications of anesthesia, initial encounter: T88.59XA

## 2022-03-25 HISTORY — PX: THYROID LOBECTOMY: SHX420

## 2022-03-25 LAB — POCT PREGNANCY, URINE: Preg Test, Ur: NEGATIVE

## 2022-03-25 SURGERY — LOBECTOMY, THYROID
Anesthesia: General | Laterality: Right

## 2022-03-25 MED ORDER — FENTANYL CITRATE (PF) 100 MCG/2ML IJ SOLN
INTRAMUSCULAR | Status: DC | PRN
  Administered 2022-03-25 (×2): 50 ug via INTRAVENOUS
  Administered 2022-03-25: 25 ug via INTRAVENOUS

## 2022-03-25 MED ORDER — CHLORHEXIDINE GLUCONATE CLOTH 2 % EX PADS: 6.0000 | MEDICATED_PAD | Freq: Once | CUTANEOUS | Status: DC

## 2022-03-25 MED ORDER — SCOPOLAMINE 1 MG/3DAYS TD PT72
1.0000 | MEDICATED_PATCH | TRANSDERMAL | Status: DC
  Administered 2022-03-25: 1 via TRANSDERMAL

## 2022-03-25 MED ORDER — OXYCODONE HCL 5 MG/5ML PO SOLN: 5.0000 mg | Freq: Once | ORAL | Status: DC | PRN

## 2022-03-25 MED ORDER — SCOPOLAMINE 1 MG/3DAYS TD PT72
MEDICATED_PATCH | TRANSDERMAL | Status: AC
  Filled 2022-03-25: qty 1

## 2022-03-25 MED ORDER — LACTATED RINGERS IV SOLN: INTRAVENOUS | Status: DC

## 2022-03-25 MED ORDER — ONDANSETRON HCL 4 MG/2ML IJ SOLN: 4.0000 mg | Freq: Once | INTRAMUSCULAR | Status: DC | PRN

## 2022-03-25 MED ORDER — CIPROFLOXACIN IN D5W 400 MG/200ML IV SOLN
400.0000 mg | INTRAVENOUS | Status: AC
  Administered 2022-03-25: 400 mg via INTRAVENOUS
  Filled 2022-03-25: qty 200

## 2022-03-25 MED ORDER — OXYCODONE HCL 5 MG PO TABS: 5.0000 mg | ORAL_TABLET | ORAL | Status: DC | PRN

## 2022-03-25 MED ORDER — ACETAMINOPHEN 325 MG PO TABS
650.0000 mg | ORAL_TABLET | Freq: Four times a day (QID) | ORAL | Status: DC | PRN
  Administered 2022-03-25 – 2022-03-26 (×3): 650 mg via ORAL
  Filled 2022-03-25 (×3): qty 2

## 2022-03-25 MED ORDER — ONDANSETRON HCL 4 MG/2ML IJ SOLN
INTRAMUSCULAR | Status: AC
  Filled 2022-03-25: qty 2

## 2022-03-25 MED ORDER — FENTANYL CITRATE (PF) 100 MCG/2ML IJ SOLN
INTRAMUSCULAR | Status: AC
  Filled 2022-03-25: qty 2

## 2022-03-25 MED ORDER — DEXAMETHASONE SODIUM PHOSPHATE 10 MG/ML IJ SOLN
INTRAMUSCULAR | Status: DC | PRN
  Administered 2022-03-25: 8 mg via INTRAVENOUS

## 2022-03-25 MED ORDER — ARIPIPRAZOLE 2 MG PO TABS
2.0000 mg | ORAL_TABLET | Freq: Every evening | ORAL | Status: DC
  Administered 2022-03-25: 2 mg via ORAL
  Filled 2022-03-25: qty 1

## 2022-03-25 MED ORDER — ROCURONIUM BROMIDE 10 MG/ML (PF) SYRINGE
PREFILLED_SYRINGE | INTRAVENOUS | Status: DC | PRN
  Administered 2022-03-25: 50 mg via INTRAVENOUS

## 2022-03-25 MED ORDER — PROPOFOL 10 MG/ML IV BOLUS
INTRAVENOUS | Status: AC
  Filled 2022-03-25: qty 20

## 2022-03-25 MED ORDER — MIDAZOLAM HCL 5 MG/5ML IJ SOLN
INTRAMUSCULAR | Status: DC | PRN
  Administered 2022-03-25: 2 mg via INTRAVENOUS

## 2022-03-25 MED ORDER — ACETAMINOPHEN 10 MG/ML IV SOLN
INTRAVENOUS | Status: AC
  Filled 2022-03-25: qty 100

## 2022-03-25 MED ORDER — CLONAZEPAM 0.5 MG PO TABS
0.5000 mg | ORAL_TABLET | Freq: Every evening | ORAL | Status: DC
  Administered 2022-03-25: 0.5 mg via ORAL
  Filled 2022-03-25: qty 1

## 2022-03-25 MED ORDER — LIDOCAINE HCL (PF) 2 % IJ SOLN
INTRAMUSCULAR | Status: AC
  Filled 2022-03-25: qty 5

## 2022-03-25 MED ORDER — SODIUM CHLORIDE 0.45 % IV SOLN: INTRAVENOUS | Status: DC

## 2022-03-25 MED ORDER — TRAMADOL HCL 50 MG PO TABS
50.0000 mg | ORAL_TABLET | Freq: Four times a day (QID) | ORAL | Status: DC | PRN
  Administered 2022-03-25 (×2): 50 mg via ORAL
  Filled 2022-03-25 (×2): qty 1

## 2022-03-25 MED ORDER — ONDANSETRON HCL 4 MG/2ML IJ SOLN
INTRAMUSCULAR | Status: DC | PRN
  Administered 2022-03-25: 4 mg via INTRAVENOUS

## 2022-03-25 MED ORDER — ACETAMINOPHEN 10 MG/ML IV SOLN: 1000.0000 mg | Freq: Once | INTRAVENOUS | Status: DC | PRN

## 2022-03-25 MED ORDER — 0.9 % SODIUM CHLORIDE (POUR BTL) OPTIME
TOPICAL | Status: DC | PRN
  Administered 2022-03-25: 1000 mL

## 2022-03-25 MED ORDER — LIDOCAINE 2% (20 MG/ML) 5 ML SYRINGE
INTRAMUSCULAR | Status: DC | PRN
  Administered 2022-03-25: 100 mg via INTRAVENOUS

## 2022-03-25 MED ORDER — ORAL CARE MOUTH RINSE: 15.0000 mL | Freq: Once | OROMUCOSAL | Status: AC

## 2022-03-25 MED ORDER — ACETAMINOPHEN 650 MG RE SUPP: 650.0000 mg | Freq: Four times a day (QID) | RECTAL | Status: DC | PRN

## 2022-03-25 MED ORDER — ROCURONIUM BROMIDE 10 MG/ML (PF) SYRINGE
PREFILLED_SYRINGE | INTRAVENOUS | Status: AC
  Filled 2022-03-25: qty 10

## 2022-03-25 MED ORDER — ONDANSETRON HCL 4 MG/2ML IJ SOLN: 4.0000 mg | Freq: Four times a day (QID) | INTRAMUSCULAR | Status: DC | PRN

## 2022-03-25 MED ORDER — SUGAMMADEX SODIUM 200 MG/2ML IV SOLN
INTRAVENOUS | Status: DC | PRN
  Administered 2022-03-25: 140 mg via INTRAVENOUS

## 2022-03-25 MED ORDER — ONDANSETRON 4 MG PO TBDP: 4.0000 mg | ORAL_TABLET | Freq: Four times a day (QID) | ORAL | Status: DC | PRN

## 2022-03-25 MED ORDER — OXYCODONE HCL 5 MG PO TABS: 5.0000 mg | ORAL_TABLET | Freq: Once | ORAL | Status: DC | PRN

## 2022-03-25 MED ORDER — CHLORHEXIDINE GLUCONATE 0.12 % MT SOLN
15.0000 mL | Freq: Once | OROMUCOSAL | Status: AC
  Administered 2022-03-25: 15 mL via OROMUCOSAL

## 2022-03-25 MED ORDER — DEXAMETHASONE SODIUM PHOSPHATE 10 MG/ML IJ SOLN
INTRAMUSCULAR | Status: AC
  Filled 2022-03-25: qty 1

## 2022-03-25 MED ORDER — ACETAMINOPHEN 10 MG/ML IV SOLN
INTRAVENOUS | Status: DC | PRN
  Administered 2022-03-25: 1000 mg via INTRAVENOUS

## 2022-03-25 MED ORDER — HEMOSTATIC AGENTS (NO CHARGE) OPTIME
TOPICAL | Status: DC | PRN
  Administered 2022-03-25: 1 via TOPICAL

## 2022-03-25 MED ORDER — DEXMEDETOMIDINE HCL IN NACL 80 MCG/20ML IV SOLN
INTRAVENOUS | Status: DC | PRN
  Administered 2022-03-25: 12 ug via BUCCAL

## 2022-03-25 MED ORDER — PROPOFOL 10 MG/ML IV BOLUS
INTRAVENOUS | Status: DC | PRN
  Administered 2022-03-25: 1300 ug/kg/min via INTRAVENOUS
  Administered 2022-03-25: 150 mg via INTRAVENOUS

## 2022-03-25 MED ORDER — MIDAZOLAM HCL 2 MG/2ML IJ SOLN
INTRAMUSCULAR | Status: AC
  Filled 2022-03-25: qty 2

## 2022-03-25 MED ORDER — HYDROMORPHONE HCL 1 MG/ML IJ SOLN: 0.2500 mg | INTRAMUSCULAR | Status: DC | PRN

## 2022-03-25 MED ORDER — AMISULPRIDE (ANTIEMETIC) 5 MG/2ML IV SOLN: 10.0000 mg | Freq: Once | INTRAVENOUS | Status: DC | PRN

## 2022-03-25 MED ORDER — HYDROMORPHONE HCL 1 MG/ML IJ SOLN: 1.0000 mg | INTRAMUSCULAR | Status: DC | PRN

## 2022-03-25 SURGICAL SUPPLY — 29 items
ATTRACTOMAT 16X20 MAGNETIC DRP (DRAPES) ×1 IMPLANT
BAG COUNTER SPONGE SURGICOUNT (BAG) ×1 IMPLANT
BLADE SURG 15 STRL LF DISP TIS (BLADE) ×1 IMPLANT
BLADE SURG 15 STRL SS (BLADE) ×1
CHLORAPREP W/TINT 26 (MISCELLANEOUS) ×1 IMPLANT
CLIP TI MEDIUM 6 (CLIP) ×2 IMPLANT
CLIP TI WIDE RED SMALL 6 (CLIP) ×2 IMPLANT
COVER SURGICAL LIGHT HANDLE (MISCELLANEOUS) ×1 IMPLANT
DERMABOND ADVANCED .7 DNX12 (GAUZE/BANDAGES/DRESSINGS) ×1 IMPLANT
DRAPE LAPAROTOMY T 98X78 PEDS (DRAPES) ×1 IMPLANT
DRAPE UTILITY XL STRL (DRAPES) ×1 IMPLANT
ELECT PENCIL ROCKER SW 15FT (MISCELLANEOUS) ×1 IMPLANT
ELECT REM PT RETURN 15FT ADLT (MISCELLANEOUS) ×1 IMPLANT
GAUZE 4X4 16PLY ~~LOC~~+RFID DBL (SPONGE) ×1 IMPLANT
GLOVE SURG ORTHO 8.0 STRL STRW (GLOVE) ×1 IMPLANT
GOWN STRL REUS W/ TWL XL LVL3 (GOWN DISPOSABLE) ×2 IMPLANT
GOWN STRL REUS W/TWL XL LVL3 (GOWN DISPOSABLE) ×2
HEMOSTAT SURGICEL 2X4 FIBR (HEMOSTASIS) ×1 IMPLANT
ILLUMINATOR WAVEGUIDE N/F (MISCELLANEOUS) ×1 IMPLANT
KIT BASIN OR (CUSTOM PROCEDURE TRAY) ×1 IMPLANT
KIT TURNOVER KIT A (KITS) IMPLANT
PACK BASIC VI WITH GOWN DISP (CUSTOM PROCEDURE TRAY) ×1 IMPLANT
SHEARS HARMONIC 9CM CVD (BLADE) ×1 IMPLANT
SUT MNCRL AB 4-0 PS2 18 (SUTURE) ×1 IMPLANT
SUT VIC AB 3-0 SH 18 (SUTURE) ×2 IMPLANT
SYR BULB IRRIG 60ML STRL (SYRINGE) ×1 IMPLANT
TOWEL OR 17X26 10 PK STRL BLUE (TOWEL DISPOSABLE) ×1 IMPLANT
TOWEL OR NON WOVEN STRL DISP B (DISPOSABLE) ×1 IMPLANT
TUBING CONNECTING 10 (TUBING) ×1 IMPLANT

## 2022-03-25 NOTE — Progress Notes (Signed)
  Transition of Care [facility]) Screening Note   Patient Details  Name: Rebecca Edwards Date of Birth: 04-23-1985   Transition of Care [facility]) CM/SW Contact:    Lennart Pall, LCSW Phone Number: 03/25/2022, 1:38 PM    Transition of Care Department [facility]) has reviewed patient and no TOC needs have been identified at this time. We will continue to monitor patient advancement through interdisciplinary progression rounds. If new patient transition needs arise, please place a TOC consult.

## 2022-03-25 NOTE — Anesthesia Procedure Notes (Signed)
Procedure Name: Intubation Date/Time: 03/25/2022 7:34 AM  Performed by: Victoriano Lain, CRNAPre-anesthesia Checklist: Patient identified, Emergency Drugs available, Suction available, Patient being monitored and Timeout performed Patient Re-evaluated:Patient Re-evaluated prior to induction Oxygen Delivery Method: Circle system utilized Preoxygenation: Pre-oxygenation with 100% oxygen Induction Type: IV induction Ventilation: Mask ventilation without difficulty Laryngoscope Size: Mac and 4 Grade View: Grade I Tube type: Oral Tube size: 7.0 mm Number of attempts: 1 Airway Equipment and Method: Stylet Placement Confirmation: ETT inserted through vocal cords under direct vision, positive ETCO2 and breath sounds checked- equal and bilateral Secured at: 21 cm Tube secured with: Tape Dental Injury: Teeth and Oropharynx as per pre-operative assessment

## 2022-03-25 NOTE — Interval H&P Note (Signed)
History and Physical Interval Note:  03/25/2022 7:03 AM  Rebecca Edwards  has presented today for surgery, with the diagnosis of ENLARGING RIGHT THYROID NODULE.  The various methods of treatment have been discussed with the patient and family. After consideration of risks, benefits and other options for treatment, the patient has consented to    Procedure(s): RIGHT THYROID LOBECTOMY (Right) as a surgical intervention.    The patient's history has been reviewed, patient examined, no change in status, stable for surgery.  I have reviewed the patient's chart and labs.  Questions were answered to the patient's satisfaction.    Armandina Gemma, [facility] [place] practice Office: [place]

## 2022-03-25 NOTE — Anesthesia Postprocedure Evaluation (Signed)
Anesthesia Post Note  Patient: AMBERA FEDELE  Procedure(s) Performed: RIGHT THYROID LOBECTOMY (Right)     Patient location during evaluation: PACU Anesthesia Type: General Level of consciousness: awake and alert Pain management: pain level controlled Vital Signs Assessment: post-procedure vital signs reviewed and stable Respiratory status: spontaneous breathing, nonlabored ventilation, respiratory function stable and patient connected to nasal cannula oxygen Cardiovascular status: blood pressure returned to baseline and stable Postop Assessment: no apparent nausea or vomiting Anesthetic complications: no  No notable events documented.  Last Vitals:  Vitals:   03/25/22 1053 03/25/22 1149  BP: (!) 140/78 138/82  Pulse: (!) 102 (!) 108  Resp: 17 17  Temp: 37.2 C 36.9 C  SpO2: 100% 100%    Last Pain:  Vitals:   03/25/22 1149  TempSrc: Oral  PainSc:                  [place]

## 2022-03-25 NOTE — Op Note (Signed)
Procedure Note  Pre-operative Diagnosis:  Right thyroid nodule  Post-operative Diagnosis:  same  Surgeon:  Armandina Gemma, MD  Assistant:  Jilda Roche, CST   Procedure:  Right thyroid lobectomy  Anesthesia:  General  Estimated Blood Loss:  minimal  Drains: none         Specimen: thyroid lobe to pathology  Indications:  Patient is referred by Dr. Jonathon Jordan for surgical evaluation and management of an enlarging right thyroid nodule with mild compressive symptoms. Patient has seen Dr. Delrae Rend in consultation from endocrinology. Patient has had a known history of thyroid disease dating back 7 to 8 years. She has had a dominant nodule in the right thyroid lobe which is gradually been enlarging. She has had 2 previous biopsies, both of which have been benign. Patient underwent ultrasound on November 02, 2021. This showed an interval enlargement in the right mid to inferior thyroid nodule from 2.8 cm up to its current level of 4.3 cm. There is a small nodule in the upper pole of the left thyroid lobe which measures 9 mm. Patient has begun to notice a globus sensation with the mass in the right thyroid lobe. She denies any significant dysphagia. She denies any dyspnea. Patient has had no prior head or neck surgery. She has never been on thyroid medication. Her most recent TSH level is normal at 0.84. There is a family history of thyroid disease in multiple family members on her mother side. Multiple family members take thyroid medication. There is no family history of thyroid cancer. Patient is a Pharmacist, hospital for [employer].  Procedure Details: Procedure was done in OR #1 at the [facility]. The patient was brought to the operating room and placed in a supine position on the operating room table. Following administration of general anesthesia, the patient was positioned and then prepped and draped in the usual aseptic fashion. After ascertaining that an adequate level of  anesthesia had been achieved, a small Kocher incision was made with #15 blade. Dissection was carried through subcutaneous tissues and platysma. Hemostasis was achieved with the electrocautery. Skin flaps were elevated cephalad and caudad from the thyroid notch to the sternal notch. A self-retaining retractor was placed for exposure. Strap muscles were incised in the midline and dissection was begun on the right side. Strap muscles were reflected laterally. The right thyroid lobe was moderately enlarged and quite firm. The lobe was gently mobilized with blunt dissection. Superior pole vessels were dissected out and divided individually between small and medium ligaclips with the harmonic scalpel. The thyroid lobe was rolled anteriorly. Branches of the inferior thyroid artery were divided between small ligaclips with the harmonic scalpel. Inferior venous tributaries were divided between ligaclips. Both the superior and inferior parathyroid glands were identified and preserved on their vascular pedicles. The recurrent laryngeal nerve was identified and preserved along its course. The ligament of Gwenlyn Found was released with the electrocautery and the gland was mobilized onto the anterior trachea. Isthmus was mobilized across the midline. There was no significant pyramidal lobe present. The thyroid parenchyma was transected at the junction of the isthmus and contralateral thyroid lobe with the harmonic scalpel. The thyroid lobe and isthmus were submitted to pathology for review.  The neck was irrigated with warm saline. Fibrillar was placed throughout the operative field. Strap muscles were approximated in the midline with interrupted 3-0 Vicryl sutures. Platysma was closed with interrupted 3-0 Vicryl sutures. Skin was closed with a running 4-0 Monocryl subcuticular suture.  Wound was washed and dried and Dermabond was applied. The patient was awakened from anesthesia and brought to the recovery room. The patient  tolerated the procedure well.   Armandina Gemma, [facility] Office: [phone]

## 2022-03-25 NOTE — Transfer of Care (Signed)
Immediate Anesthesia Transfer of Care Note  Patient: Rebecca Edwards  Procedure(s) Performed: RIGHT THYROID LOBECTOMY (Right)  Patient Location: PACU  Anesthesia Type:General  Level of Consciousness: awake, alert , oriented, and patient cooperative  Airway & Oxygen Therapy: Patient Spontanous Breathing and Patient connected to face mask oxygen  Post-op Assessment: Report given to RN, Post -op Vital signs reviewed and stable, and Patient moving all extremities  Post vital signs: Reviewed and stable  Last Vitals:  Vitals Value Taken Time  BP 104/65 03/25/22 0901  Temp    Pulse 96 03/25/22 0902  Resp 17 03/25/22 0902  SpO2 100 % 03/25/22 0902  Vitals shown include unvalidated device data.  Last Pain:  Vitals:   03/25/22 0551  TempSrc: Oral  PainSc: 0-No pain         Complications: No notable events documented.

## 2022-03-26 ENCOUNTER — Encounter (HOSPITAL_COMMUNITY): Payer: Self-pay | Admitting: Surgery

## 2022-03-26 DIAGNOSIS — E042 Nontoxic multinodular goiter: Secondary | ICD-10-CM | POA: Diagnosis not present

## 2022-03-26 NOTE — Discharge Instructions (Signed)
[facility] - Dr. Cheyenne Schumm  THYROID & PARATHYROID SURGERY:  POST-OP INSTRUCTIONS  Always review the instruction sheet provided by the hospital nurse at discharge.  A prescription for pain medication may be sent to your pharmacy at the time of discharge.  Take your pain medication as prescribed.  If narcotic pain medicine is not needed, then you may take acetaminophen (Tylenol) or ibuprofen (Advil) as needed for pain or soreness.  Take your normal home medications as prescribed unless otherwise directed.  If you need a refill on your pain medication, please contact the office during regular business hours.  Prescriptions will not be processed by the office after 5:00PM or on weekends.  Start with a light diet upon arrival home, such as soup and crackers or toast.  Be sure to drink plenty of fluids.  Resume your normal diet the day after surgery.  Most patients will experience some swelling and bruising on the chest and neck area.  Ice packs will help for the first 48 hours after arriving home.  Swelling and bruising will take several days to resolve.   It is common to experience some constipation after surgery.  Increasing fluid intake and taking a stool softener (Colace) will usually help to prevent this problem.  A mild laxative (Milk of Magnesia or Miralax) should be taken according to package directions if there has been no bowel movement after 48 hours.  Dermabond glue covers your incision. This seals the wound and you may shower at any time. The Dermabond will remain in place for about a week.  You may gradually remove the glue when it loosens around the edges.  If you need to loosen the Dermabond for removal, apply a layer of Vaseline to the wound for 15 minutes and then remove with a Kleenex. Your sutures are under the skin and will not show - they will dissolve on their own.  You may resume light daily activities beginning the day after discharge (such as self-care,  walking, climbing stairs), gradually increasing activities as tolerated. You may have sexual intercourse when it is comfortable. Refrain from any heavy lifting or straining until approved by your doctor. You may drive when you no longer are taking prescription pain medication, you can comfortably wear a seatbelt, and you can safely maneuver your car and apply the brakes.  You will see your doctor in the office for a follow-up appointment approximately three weeks after your surgery.  Make sure that you call for this appointment within a day or two after you arrive home to insure a convenient appointment time. Please have any requested laboratory tests performed a few days prior to your office visit so that the results will be available at your follow up appointment.  WHEN TO CALL THE [facility] OFFICE: -- Fever greater than 101.5 -- Inability to urinate -- Nausea and/or vomiting - persistent -- Extreme swelling or bruising -- Continued bleeding from incision -- Increased pain, redness, or drainage from the incision -- Difficulty swallowing or breathing -- Muscle cramping or spasms -- Numbness or tingling in hands or around lips  The clinic staff is available to answer your questions during regular business hours.  Please don't hesitate to call and ask to speak to one of the nurses if you have concerns.  [facility] OFFICE: [phone] (24 hours)  Please sign up for MyChart accounts. This will allow you to communicate directly with my nurse or myself without having to call the office. It will also allow you   to view your test results. You will need to enroll in MyChart for my office ([facility]) and for the hospital ([facility]).  Zian Delair, MD [facility] A [facility] practice 

## 2022-03-26 NOTE — Progress Notes (Signed)
Pt given d/c instructions and all questions answered. Pt taken via wheelchair to front entrance.

## 2022-03-26 NOTE — Discharge Summary (Signed)
Physician Discharge Summary   Patient ID: Rebecca Edwards MRN: [id number] DOB/AGE: 09/28/1985 36 y.o.  Admit date: 03/25/2022  Discharge date: 03/26/2022  Discharge Diagnoses:  Principal Problem:   Right thyroid nodule   Discharged Condition: good  Hospital Course: Patient was admitted for observation following thyroid surgery.  Post op course was uncomplicated.  Pain was well controlled.  Tolerated diet. Patient was prepared for discharge home on POD#1.  Consults: None  Treatments: surgery: right thyroid lobectomy  Discharge Exam: Blood pressure 116/76, pulse (!) 102, temperature 98.3 F (36.8 C), temperature source Oral, resp. rate 16, last menstrual period 03/13/2022, SpO2 100 %. HEENT - clear Neck - wound dry and intact; mild STS; voice normal; Dermabond in place  Disposition: Home  Discharge Instructions     Diet - low sodium heart healthy   Complete by: As directed    Increase activity slowly   Complete by: As directed    No dressing needed   Complete by: As directed       Allergies as of 03/26/2022       Reactions   Amoxicillin    Unknown reaction  Has patient had a PCN reaction causing immediate rash, facial/tongue/throat swelling, SOB or lightheadedness with hypotension: Unknown Has patient had a PCN reaction causing severe rash involving mucus membranes or skin necrosis: Unknown Has patient had a PCN reaction that required hospitalization: Unknown Has patient had a PCN reaction occurring within the last 10 years: No If all of the above answers are "NO", then may proceed with Cephalosporin use.   Other Other (See Comments)   Spinal anesthesia (spinal block)--itching all over body   Hydrocodone-acetaminophen Itching   Hydrocodone-acetaminophen Itching   Propoxyphene Itching   Propoxyphene N-acetaminophen Itching   Pseudoephedrine Palpitations        Medication List     TAKE these medications    acetaminophen 500 MG tablet Commonly  known as: TYLENOL Take 500 mg by mouth every 6 (six) hours as needed (headaches/pain.).   ARIPiprazole 2 MG tablet Commonly known as: ABILIFY Take 2 mg by mouth every evening.   ascorbic acid 500 MG tablet Commonly known as: VITAMIN C Take 500 mg by mouth every evening.   clonazePAM 0.5 MG tablet Commonly known as: KLONOPIN Take 0.25 mg by mouth every evening.   ibuprofen 200 MG tablet Commonly known as: ADVIL Take 600 mg by mouth every 8 (eight) hours as needed (for pain/headaches.).   levocetirizine 5 MG tablet Commonly known as: XYZAL Take 5 mg by mouth daily as needed for allergies.   multivitamin with minerals Tabs tablet Take 1 tablet by mouth daily.   PROBIOTIC PO Take 1 capsule by mouth in the morning.   venlafaxine XR 37.5 MG 24 hr capsule Commonly known as: EFFEXOR-XR Take 37.5 mg by mouth 3 (three) times a week. 2-3 days a week   Vitamin D 50 MCG (2000 UT) tablet Take 2,000 Units by mouth in the morning.               Discharge Care Instructions  (From admission, onward)           Start     Ordered   03/26/22 0000  No dressing needed        03/26/22 0800            Follow-up Information     Armandina Gemma, MD. Schedule an appointment as soon as possible for a visit in 3 week(s).   Specialty:  General Surgery Why: For wound re-check Contact information: [place] [address] [phone]                 Kimberla Driskill, [facility] Office: [phone]   Signed: Armandina Gemma 03/26/2022, 8:00 AM

## 2022-03-27 LAB — SURGICAL PATHOLOGY

## 2022-03-27 NOTE — Progress Notes (Signed)
Final pathology is benign, as expected.  Good news!  [place] Office: [phone]

## 2022-11-22 ENCOUNTER — Encounter (HOSPITAL_COMMUNITY): Payer: Self-pay | Admitting: Student

## 2022-11-22 ENCOUNTER — Inpatient Hospital Stay (HOSPITAL_COMMUNITY)
Admission: EM | Admit: 2022-11-22 | Discharge: 2022-11-28 | DRG: 558 | Disposition: A | Discharge status: Home / Self Care | Payer: BC Managed Care – PPO | Attending: Internal Medicine | Admitting: Internal Medicine

## 2022-11-22 ENCOUNTER — Other Ambulatory Visit: Payer: Self-pay

## 2022-11-22 DIAGNOSIS — E871 Hypo-osmolality and hyponatremia: Secondary | ICD-10-CM | POA: Diagnosis present

## 2022-11-22 DIAGNOSIS — Z79899 Other long term (current) drug therapy: Secondary | ICD-10-CM

## 2022-11-22 DIAGNOSIS — Z888 Allergy status to other drugs, medicaments and biological substances status: Secondary | ICD-10-CM

## 2022-11-22 DIAGNOSIS — Z884 Allergy status to anesthetic agent status: Secondary | ICD-10-CM

## 2022-11-22 DIAGNOSIS — D72829 Elevated white blood cell count, unspecified: Secondary | ICD-10-CM | POA: Diagnosis present

## 2022-11-22 DIAGNOSIS — R739 Hyperglycemia, unspecified: Secondary | ICD-10-CM | POA: Diagnosis present

## 2022-11-22 DIAGNOSIS — Z8639 Personal history of other endocrine, nutritional and metabolic disease: Secondary | ICD-10-CM

## 2022-11-22 DIAGNOSIS — K219 Gastro-esophageal reflux disease without esophagitis: Secondary | ICD-10-CM | POA: Diagnosis present

## 2022-11-22 DIAGNOSIS — Z880 Allergy status to penicillin: Secondary | ICD-10-CM | POA: Diagnosis not present

## 2022-11-22 DIAGNOSIS — F32A Depression, unspecified: Secondary | ICD-10-CM | POA: Insufficient documentation

## 2022-11-22 DIAGNOSIS — E876 Hypokalemia: Secondary | ICD-10-CM | POA: Diagnosis present

## 2022-11-22 DIAGNOSIS — E7800 Pure hypercholesterolemia, unspecified: Secondary | ICD-10-CM | POA: Diagnosis present

## 2022-11-22 DIAGNOSIS — M6282 Rhabdomyolysis: Principal | ICD-10-CM | POA: Diagnosis present

## 2022-11-22 DIAGNOSIS — E039 Hypothyroidism, unspecified: Secondary | ICD-10-CM | POA: Diagnosis present

## 2022-11-22 DIAGNOSIS — R748 Abnormal levels of other serum enzymes: Secondary | ICD-10-CM | POA: Insufficient documentation

## 2022-11-22 DIAGNOSIS — F419 Anxiety disorder, unspecified: Secondary | ICD-10-CM | POA: Diagnosis present

## 2022-11-22 DIAGNOSIS — Z885 Allergy status to narcotic agent status: Secondary | ICD-10-CM | POA: Diagnosis not present

## 2022-11-22 DIAGNOSIS — M791 Myalgia, unspecified site: Principal | ICD-10-CM

## 2022-11-22 LAB — COMPREHENSIVE METABOLIC PANEL
ALT: 68 U/L — ABNORMAL HIGH (ref 0–44)
ALT: 73 U/L — ABNORMAL HIGH (ref 0–44)
AST: 242 U/L — ABNORMAL HIGH (ref 15–41)
AST: 286 U/L — ABNORMAL HIGH (ref 15–41)
Albumin: 3.7 g/dL (ref 3.5–5.0)
Albumin: 3.1 g/dL — ABNORMAL LOW (ref 3.5–5.0)
Alkaline Phosphatase: 75 U/L (ref 38–126)
Alkaline Phosphatase: 69 U/L (ref 38–126)
Anion gap: 10 (ref 5–15)
Anion gap: 8 (ref 5–15)
BUN: 9 mg/dL (ref 6–20)
BUN: 8 mg/dL (ref 6–20)
CO2: 21 mmol/L — ABNORMAL LOW (ref 22–32)
CO2: 21 mmol/L — ABNORMAL LOW (ref 22–32)
Calcium: 8.3 mg/dL — ABNORMAL LOW (ref 8.9–10.3)
Calcium: 8.2 mg/dL — ABNORMAL LOW (ref 8.9–10.3)
Chloride: 104 mmol/L (ref 98–111)
Chloride: 109 mmol/L (ref 98–111)
Creatinine, Ser: 0.82 mg/dL (ref 0.44–1.00)
Creatinine, Ser: 0.84 mg/dL (ref 0.44–1.00)
GFR, Estimated: >60 mL/min (ref >60)
GFR, Estimated: >60 mL/min (ref >60)
Glucose, Bld: 119 mg/dL — ABNORMAL HIGH (ref 70–99)
Glucose, Bld: 94 mg/dL (ref 70–99)
Potassium: 3.5 mmol/L (ref 3.5–5.1)
Potassium: 3.7 mmol/L (ref 3.5–5.1)
Sodium: 135 mmol/L (ref 135–145)
Sodium: 138 mmol/L (ref 135–145)
Total Bilirubin: 0.6 mg/dL (ref 0.3–1.2)
Total Bilirubin: 0.7 mg/dL (ref 0.3–1.2)
Total Protein: 6.8 g/dL (ref 6.5–8.1)
Total Protein: 5.9 g/dL — ABNORMAL LOW (ref 6.5–8.1)

## 2022-11-22 LAB — HCG, SERUM, QUALITATIVE: Preg, Serum: NEGATIVE

## 2022-11-22 LAB — CBC WITH DIFFERENTIAL/PLATELET
Abs Immature Granulocytes: 0.05 10*3/uL (ref 0.00–0.07)
Basophils Absolute: 0.1 10*3/uL (ref 0.0–0.1)
Basophils Relative: 1 %
Eosinophils Absolute: 0.1 10*3/uL (ref 0.0–0.5)
Eosinophils Relative: 1 %
HCT: 37.7 % (ref 36.0–46.0)
Hemoglobin: 12.2 g/dL (ref 12.0–15.0)
Immature Granulocytes: 0 %
Lymphocytes Relative: 21 %
Lymphs Abs: 2.7 10*3/uL (ref 0.7–4.0)
MCH: 27.8 pg (ref 26.0–34.0)
MCHC: 32.4 g/dL (ref 30.0–36.0)
MCV: 85.9 fL (ref 80.0–100.0)
Monocytes Absolute: 1 10*3/uL (ref 0.1–1.0)
Monocytes Relative: 8 %
Neutro Abs: 8.6 10*3/uL — ABNORMAL HIGH (ref 1.7–7.7)
Neutrophils Relative %: 69 %
Platelets: 353 10*3/uL (ref 150–400)
RBC: 4.39 MIL/uL (ref 3.87–5.11)
RDW: 14.8 % (ref 11.5–15.5)
WBC: 12.5 10*3/uL — ABNORMAL HIGH (ref 4.0–10.5)
nRBC: 0 % (ref 0.0–0.2)

## 2022-11-22 LAB — HEMOGLOBIN A1C
Hgb A1c MFr Bld: 5.6 % (ref 4.8–5.6)
Mean Plasma Glucose: 114.02 mg/dL

## 2022-11-22 LAB — CBC
HCT: 33.7 % — ABNORMAL LOW (ref 36.0–46.0)
Hemoglobin: 10.8 g/dL — ABNORMAL LOW (ref 12.0–15.0)
MCH: 27 pg (ref 26.0–34.0)
MCHC: 32 g/dL (ref 30.0–36.0)
MCV: 84.3 fL (ref 80.0–100.0)
Platelets: 321 10*3/uL (ref 150–400)
RBC: 4 MIL/uL (ref 3.87–5.11)
RDW: 15 % (ref 11.5–15.5)
WBC: 11 10*3/uL — ABNORMAL HIGH (ref 4.0–10.5)
nRBC: 0 % (ref 0.0–0.2)

## 2022-11-22 LAB — CK
Total CK: 29854 U/L — ABNORMAL HIGH (ref 38–234)
Total CK: 32971 U/L — ABNORMAL HIGH (ref 38–234)

## 2022-11-22 LAB — MAGNESIUM
Magnesium: 2.2 mg/dL (ref 1.7–2.4)
Magnesium: 2.2 mg/dL (ref 1.7–2.4)

## 2022-11-22 LAB — TSH: TSH: 3.848 u[IU]/mL (ref 0.350–4.500)

## 2022-11-22 LAB — HIV ANTIBODY (ROUTINE TESTING W REFLEX): HIV Screen 4th Generation wRfx: NONREACTIVE

## 2022-11-22 LAB — PHOSPHORUS: Phosphorus: 3.7 mg/dL (ref 2.5–4.6)

## 2022-11-22 MED ORDER — METHOCARBAMOL 500 MG PO TABS: 500.0000 mg | ORAL_TABLET | Freq: Two times a day (BID) | ORAL | 0 refills | Status: AC

## 2022-11-22 MED ORDER — IBUPROFEN 400 MG PO TABS
600.0000 mg | ORAL_TABLET | Freq: Once | ORAL | Status: AC
  Administered 2022-11-22: 600 mg via ORAL
  Filled 2022-11-22: qty 1

## 2022-11-22 MED ORDER — METHOCARBAMOL 500 MG PO TABS
500.0000 mg | ORAL_TABLET | Freq: Three times a day (TID) | ORAL | Status: DC | PRN
  Administered 2022-11-22 – 2022-11-26 (×3): 500 mg via ORAL
  Filled 2022-11-22 (×3): qty 1

## 2022-11-22 MED ORDER — METHOCARBAMOL 500 MG PO TABS
500.0000 mg | ORAL_TABLET | Freq: Once | ORAL | Status: AC
  Administered 2022-11-22: 500 mg via ORAL
  Filled 2022-11-22: qty 1

## 2022-11-22 MED ORDER — SENNOSIDES-DOCUSATE SODIUM 8.6-50 MG PO TABS: 1.0000 | ORAL_TABLET | Freq: Two times a day (BID) | ORAL | Status: DC | PRN

## 2022-11-22 MED ORDER — ACETAMINOPHEN 650 MG RE SUPP
650.0000 mg | Freq: Four times a day (QID) | RECTAL | Status: DC | PRN
  Administered 2022-11-22: 650 mg via RECTAL

## 2022-11-22 MED ORDER — SODIUM CHLORIDE 0.9 % IV BOLUS
1000.0000 mL | Freq: Once | INTRAVENOUS | Status: AC
  Administered 2022-11-22: 1000 mL via INTRAVENOUS

## 2022-11-22 MED ORDER — LACTATED RINGERS IV BOLUS
1000.0000 mL | Freq: Once | INTRAVENOUS | Status: AC
  Administered 2022-11-22: 1000 mL via INTRAVENOUS

## 2022-11-22 MED ORDER — POLYETHYLENE GLYCOL 3350 17 G PO PACK: 17.0000 g | PACK | Freq: Two times a day (BID) | ORAL | Status: DC | PRN

## 2022-11-22 MED ORDER — LACTATED RINGERS IV SOLN: INTRAVENOUS | Status: DC

## 2022-11-22 MED ORDER — ACETAMINOPHEN 325 MG PO TABS
650.0000 mg | ORAL_TABLET | Freq: Four times a day (QID) | ORAL | Status: DC | PRN
  Administered 2022-11-23 – 2022-11-26 (×6): 650 mg via ORAL
  Filled 2022-11-22 (×8): qty 2

## 2022-11-22 MED ORDER — ENOXAPARIN SODIUM 40 MG/0.4ML IJ SOSY
40.0000 mg | PREFILLED_SYRINGE | INTRAMUSCULAR | Status: DC
  Administered 2022-11-22 – 2022-11-23 (×2): 40 mg via SUBCUTANEOUS
  Filled 2022-11-22 (×2): qty 0.4

## 2022-11-22 NOTE — ED Provider Notes (Signed)
Care assumed from previous provider.  See note for full HPI.  Examination 37 year old recently started working out on Wednesday with a Systems analyst.  Has severe muscle cramping and aching to her bilateral lower extremities.  Difficulty walking due to pain.  Subsequently came here.  Labs show elevated LFT.  She is pending CK.  Plan of CK within normal limits can DC home Physical Exam  BP 132/74 (BP Location: Right Arm)   Pulse 87   Temp 99.1 F (37.3 C) (Oral)   Resp 18   SpO2 100%   Physical Exam Vitals and nursing note reviewed.  Constitutional:      General: She is not in acute distress.    Appearance: She is well-developed. She is not ill-appearing.  HENT:     Head: Atraumatic.  Eyes:     Pupils: Pupils are equal, round, and reactive to light.  Cardiovascular:     Rate and Rhythm: Normal rate.     Pulses: Normal pulses.  Pulmonary:     Effort: No respiratory distress.  Abdominal:     General: There is no distension.  Musculoskeletal:        General: Tenderness present. No swelling, deformity or signs of injury. Normal range of motion.     Cervical back: Normal range of motion.     Right lower leg: No edema.     Left lower leg: No edema.  Skin:    General: Skin is warm and dry.     Capillary Refill: Capillary refill takes less than 2 seconds.  Neurological:     General: No focal deficit present.     Mental Status: She is alert.  Psychiatric:        Mood and Affect: Mood normal.     Procedures  .Critical Care  Performed by: Linwood Dibbles, PA-C Authorized by: Linwood Dibbles, PA-C   Critical care provider statement:    Critical care time (minutes):  35   Critical care was necessary to treat or prevent imminent or life-threatening deterioration of the following conditions:  Dehydration and circulatory failure   Critical care was time spent personally by me on the following activities:  Development of treatment plan with patient or surrogate,  discussions with consultants, evaluation of patient's response to treatment, examination of patient, ordering and review of laboratory studies, ordering and review of radiographic studies, ordering and performing treatments and interventions, pulse oximetry, re-evaluation of patient's condition and review of old charts  Labs Reviewed  CBC WITH DIFFERENTIAL/PLATELET - Abnormal; Notable for the following components:      Result Value   WBC 12.5 (*)    Neutro Abs 8.6 (*)    All other components within normal limits  COMPREHENSIVE METABOLIC PANEL - Abnormal; Notable for the following components:   CO2 21 (*)    Glucose, Bld 119 (*)    Calcium 8.3 (*)    AST 242 (*)    ALT 68 (*)    All other components within normal limits  CK - Abnormal; Notable for the following components:   Total CK 29,854 (*)    All other components within normal limits  HCG, SERUM, QUALITATIVE  PHOSPHORUS  MAGNESIUM    No results found.   Clinical Course as of 11/22/22 0739  Fri Nov 22, 2022  4098 Pending CK. If ok can dc home [BH]    Clinical Course User Index [BH] Mylin Gignac A, PA-C    Care assumed from previous provider.  See note  for full HPI.  Examination 37 year old recently started working out on Wednesday with a Systems analyst.  Has severe muscle cramping and aching to her bilateral lower extremities.  Difficulty walking due to pain.  Subsequently came here.  Labs show elevated LFT.  She is pending CK.  Plan of CK within normal limits can DC home  Sx not consistent with VTE, infection, ischemia, compartment syndrome  Labs and imaging personally viewed and interpreted:  CBC WBC 12.5 Metabolic panel elevated AST, ALT Pregnancy test negative CK [id number]  Patient with rhabdomyolysis.  She was given 1 L of fluid.  Will give additional fluids and admit for further management.  CONSULT with Dr. Alanda Slim with hospitalist how is agreeable to evaluate patient for admission  The patient appears  reasonably stabilized for admission considering the current resources, flow, and capabilities available in the ED at this time, and I doubt any other [facility] requiring further screening and/or treatment in the ED prior to admission.    Medical Decision Making Amount and/or Complexity of Data Reviewed Independent Historian: friend External Data Reviewed: labs, radiology and notes. Labs: ordered. Decision-making details documented in ED Course.  Risk OTC drugs. Prescription drug management. Parenteral controlled substances. Decision regarding hospitalization.   Rhabdomyolysis       Jadin Creque A, PA-C 11/22/22 0739    Loetta Rough, MD 11/22/22 [phone]

## 2022-11-22 NOTE — Discharge Instructions (Addendum)
You were evaluated today for muscle pain. This is likely due to your recent strenuous workout. Please take the prescribed robaxin as directed. It may cause drowsiness so do not take if you are planning to drive. You may also take 600mg  ibuprofen every 6 hours. Follow up as needed with your primary care provider.

## 2022-11-22 NOTE — ED Notes (Signed)
ED TO INPATIENT HANDOFF REPORT  ED Nurse Name and Phone #: Leeroy Bock 21  S Name/Age/Gender Rebecca Edwards 37 y.o. female Room/Bed: H012C/H012C  Code Status   Code Status: Prior  Home/SNF/Other Home Patient oriented to: self, place, time, and situation Is this baseline? Yes   Triage Complete: Triage complete  Chief Complaint Exertional rhabdomyolysis [M62.82]  Triage Note Patient reports muscle pain/cramps/spasms at bilateral thighs after rigid workout with her trainer last Wednesday unrelieved by OTC pain medication .    Allergies Allergies  Allergen Reactions   Amoxicillin     Unknown reaction  Has patient had a PCN reaction causing immediate rash, facial/tongue/throat swelling, SOB or lightheadedness with hypotension: Unknown Has patient had a PCN reaction causing severe rash involving mucus membranes or skin necrosis: Unknown Has patient had a PCN reaction that required hospitalization: Unknown Has patient had a PCN reaction occurring within the last 10 years: No If all of the above answers are "NO", then may proceed with Cephalosporin use.    Other Other (See Comments)    Spinal anesthesia (spinal block)--itching all over body   Hydrocodone-Acetaminophen Itching   Hydrocodone-Acetaminophen Itching   Propoxyphene Itching   Propoxyphene N-Acetaminophen Itching   Pseudoephedrine Palpitations    Level of Care/Admitting Diagnosis ED Disposition     ED Disposition  Admit   Condition  --   Comment  Hospital Area: [facility] [[id number]]  Level of Care: Med-Surg [16]  May place patient in observation at [facility] or [facility] if equivalent level of care is available:: No  Covid Evaluation: Asymptomatic - no recent exposure (last 10 days) testing not required  Diagnosis: Exertional rhabdomyolysis [0272536]  Admitting Physician: Almon Hercules [[id number]]  Attending Physician: Almon Hercules [[id number]]          B Medical/Surgery  History Past Medical History:  Diagnosis Date   Complication of anesthesia    Depression    GERD (gastroesophageal reflux disease)    Headache    History of hiatal hernia    Hyperthyroidism    Ovarian cyst    Palpitations    Panic attacks    Thyroid disease    Hyper   Past Surgical History:  Procedure Laterality Date   CESAREAN SECTION     ESOPHAGOGASTRODUODENOSCOPY ENDOSCOPY     THYROID LOBECTOMY Right 03/25/2022   Procedure: RIGHT THYROID LOBECTOMY;  Surgeon: Darnell Level, MD;  Location: [facility];  Service: General;  Laterality: Right;   TUBAL LIGATION     WISDOM TOOTH EXTRACTION       A IV Location/Drains/Wounds Patient Lines/Drains/Airways Status     Active Line/Drains/Airways     Name Placement date Placement time Site Days   Peripheral IV 11/22/22 20 G Anterior;Left;Proximal Forearm 11/22/22  0520  Forearm  less than 1   Incision (Closed) 03/25/22 Neck Other (Comment) 03/25/22  0840  -- 242            Intake/Output Last 24 hours No intake or output data in the 24 hours ending 11/22/22 0738  Labs/Imaging Results for orders placed or performed during the hospital encounter of 11/22/22 (from the past 48 hour(s))  CBC with Differential     Status: Abnormal   Collection Time: 11/22/22  2:35 AM  Result Value Ref Range   WBC 12.5 (H) 4.0 - 10.5 K/uL   RBC 4.39 3.87 - 5.11 MIL/uL   Hemoglobin 12.2 12.0 - 15.0 g/dL   HCT 87.5 64.3 - 32.9 %   MCV  85.9 80.0 - 100.0 fL   MCH 27.8 26.0 - 34.0 pg   MCHC 32.4 30.0 - 36.0 g/dL   RDW 16.1 09.6 - 04.5 %   Platelets 353 150 - 400 K/uL   nRBC 0.0 0.0 - 0.2 %   Neutrophils Relative % 69 %   Neutro Abs 8.6 (H) 1.7 - 7.7 K/uL   Lymphocytes Relative 21 %   Lymphs Abs 2.7 0.7 - 4.0 K/uL   Monocytes Relative 8 %   Monocytes Absolute 1.0 0.1 - 1.0 K/uL   Eosinophils Relative 1 %   Eosinophils Absolute 0.1 0.0 - 0.5 K/uL   Basophils Relative 1 %   Basophils Absolute 0.1 0.0 - 0.1 K/uL   Immature Granulocytes 0 %   Abs  Immature Granulocytes 0.05 0.00 - 0.07 K/uL    Comment: Performed at [facility], [address], [place], [place] [address]  Comprehensive metabolic panel     Status: Abnormal   Collection Time: 11/22/22  2:35 AM  Result Value Ref Range   Sodium 135 135 - 145 mmol/L   Potassium 3.5 3.5 - 5.1 mmol/L   Chloride 104 98 - 111 mmol/L   CO2 21 (L) 22 - 32 mmol/L   Glucose, Bld 119 (H) 70 - 99 mg/dL    Comment: Glucose reference range applies only to samples taken after fasting for at least 8 hours.   BUN 9 6 - 20 mg/dL   Creatinine, Ser 1.91 0.44 - 1.00 mg/dL   Calcium 8.3 (L) 8.9 - 10.3 mg/dL   Total Protein 6.8 6.5 - 8.1 g/dL   Albumin 3.7 3.5 - 5.0 g/dL   AST 478 (H) 15 - 41 U/L   ALT 68 (H) 0 - 44 U/L   Alkaline Phosphatase 75 38 - 126 U/L   Total Bilirubin 0.6 0.3 - 1.2 mg/dL   GFR, Estimated >29 >56 mL/min    Comment: (NOTE) Calculated using the CKD-EPI Creatinine Equation (2021)    Anion gap 10 5 - 15    Comment: Performed at [facility], [address], [place], [place] [address]  hCG, serum, qualitative     Status: None   Collection Time: 11/22/22  2:35 AM  Result Value Ref Range   Preg, Serum NEGATIVE NEGATIVE    Comment:        THE SENSITIVITY OF THIS METHODOLOGY IS >10 mIU/mL. Performed at [facility], [address] [place], [place] [address]   CK     Status: Abnormal   Collection Time: 11/22/22  2:35 AM  Result Value Ref Range   Total CK 29,854 (H) 38 - 234 U/L    Comment: RESULTS CONFIRMED BY MANUAL DILUTION Performed at [facility], [address] [place], [place] [address]    No results found.  Pending Labs Unresulted Labs (From admission, onward)     Start     Ordered   11/22/22 0730  Phosphorus  Once,   STAT        11/22/22 0729   11/22/22 0730  Magnesium  Once,   STAT        11/22/22 0729            Vitals/Pain Today's Vitals   11/22/22 0224 11/22/22 0225 11/22/22 0505  BP: (!) 147/92  132/74  Pulse: (!) 127   87  Resp: 17  18  Temp: 98 F (36.7 C)  99.1 F (37.3 C)  TempSrc:   Oral  SpO2: 100%  100%  PainSc:  7      Isolation Precautions No active isolations  Medications Medications  lactated ringers bolus 1,000 mL (has no administration in time range)  lactated ringers infusion (has no administration in time range)  methocarbamol (ROBAXIN) tablet 500 mg (500 mg Oral Given 11/22/22 0535)  sodium chloride 0.9 % bolus 1,000 mL (1,000 mLs Intravenous New Bag/Given 11/22/22 0535)  ibuprofen (ADVIL) tablet 600 mg (600 mg Oral Given 11/22/22 0651)    Mobility walks     Focused Assessments Cramping in legs after intense workout last week, has CK of 29,854 U/L    R Recommendations: See Admitting Provider Note  Report given to:   Additional Notes:

## 2022-11-22 NOTE — ED Triage Notes (Signed)
Patient reports muscle pain/cramps/spasms at bilateral thighs after rigid workout with her trainer last Wednesday unrelieved by OTC pain medication .

## 2022-11-22 NOTE — Progress Notes (Signed)
Transition of Care [facility]) - Inpatient Brief Assessment   Patient Details  Name: Rebecca Edwards MRN: [id number] Date of Birth: 1985-10-17  Transition of [facility] CM/SW Contact:    Janae Bridgeman, RN Phone Number: 11/22/2022, 1:51 PM   Clinical Narrative: Patient admitted to the hospital with exertional rhabdomyolysis.  The patient will likely discharge back home when medically stable to discharge.  No TOC needs at this time.   Transition of Care Asessment: Insurance and Status: (P) Insurance coverage has been reviewed Patient has primary care physician: (P) Yes Home environment has been reviewed: (P) Yes - From home Prior level of function:: (P) Independent Prior/Current Home Services: (P) No current home services Social Determinants of Health Reivew: (P) SDOH reviewed no interventions necessary Readmission risk has been reviewed: (P) Yes Transition of care needs: (P) no transition of care needs at this time

## 2022-11-22 NOTE — H&P (Signed)
History and Physical    REEGHAN CHARTER ZOX:[id number] DOB: 05/19/1985 DOA: 11/22/2022  PCP: Mila Palmer, MD Patient coming from: Home.  Chief Complaint: Muscle cramping and aching  HPI: Rebecca Edwards 37 year old F with PMH of anxiety, depression and thyroid nodule s/p lobectomy in 03/2022 presenting with bilateral leg cramping and aching.  Patient saw her PCP recently and was told to have elevated cholesterol and glucose.  She was taken off the Abilify and advised to implement lifestyle change including exercise twice a week and losing some weight.  She hired Systems analyst right away and was given exercise regimen that she started on Wednesday (2 days ago).  She was given a lot of upper leg exercises that she did for 45 minutes.  The next day, she felt cramping and pain in her legs and reached out to personal trainer.  She was told aching and cramping after first day of exercise is not unusual and advised to continue.  However, hip pain gotten worse and her legs started locking up making it hard to walk so she decided to come to ED for evaluation.  She denies numbness, tingling, fever, chills, fall or trauma, chest pain, shortness of breath, nausea, vomiting, abdominal pain, urine color change, dysuria, frequency or urgency.  No prior history of rhabdomyolysis.  No new medication.  Denies smoking cigarette, drinking alcohol or recreational drug use.  Likes to have cardiopulmonary resuscitation in an event of sudden cardiopulmonary arrest.  In ED, stable vitals.  AST 242.  ALT 68.  Glucose 119.  CK 30,000.  WBC 12.5.  UA with small Hgb.  Pregnancy test negative.  Received 1 L of LR and started on LR at 100 cc an hour.  Hospitalist service called for admission for rhabdomyolysis.   ROS All review of system negative except for pertinent positives and negatives as history of present illness above.  PMH Past Medical History:  Diagnosis Date   Depression    GERD (gastroesophageal  reflux disease)    Headache    History of hiatal hernia    Hyperthyroidism    Ovarian cyst    Palpitations    Panic attacks    Thyroid disease    Hyper   PSH Past Surgical History:  Procedure Laterality Date   CESAREAN SECTION     ESOPHAGOGASTRODUODENOSCOPY ENDOSCOPY     THYROID LOBECTOMY Right 03/25/2022   Procedure: RIGHT THYROID LOBECTOMY;  Surgeon: Darnell Level, MD;  Location: [facility];  Service: General;  Laterality: Right;   TUBAL LIGATION     WISDOM TOOTH EXTRACTION     Fam HX Family History  Problem Relation Age of Onset   Healthy Mother    Healthy Father     Social Hx  reports that she has never smoked. She has never used smokeless tobacco. She reports that she does not drink alcohol and does not use drugs.  Allergy Allergies  Allergen Reactions   Amoxicillin     Unknown reaction  Has patient had a PCN reaction causing immediate rash, facial/tongue/throat swelling, SOB or lightheadedness with hypotension: Unknown Has patient had a PCN reaction causing severe rash involving mucus membranes or skin necrosis: Unknown Has patient had a PCN reaction that required hospitalization: Unknown Has patient had a PCN reaction occurring within the last 10 years: No If all of the above answers are "NO", then may proceed with Cephalosporin use.    Other Other (See Comments)    Spinal anesthesia (spinal block)--itching all over body  Hydrocodone-Acetaminophen Itching   Hydrocodone-Acetaminophen Itching   Propoxyphene Itching   Propoxyphene N-Acetaminophen Itching   Pseudoephedrine Palpitations   Home Meds Prior to Admission medications   Medication Sig Start Date End Date Taking? Authorizing Provider  ascorbic acid (VITAMIN C) 500 MG tablet Take 500 mg by mouth every evening.   Yes [provider]  Cholecalciferol (VITAMIN D) 50 MCG (2000 UT) tablet Take 2,000 Units by mouth in the morning.   Yes [provider]  clonazePAM (KLONOPIN) 0.5 MG tablet Take  0.25 mg by mouth every evening.   Yes [provider]  methocarbamol (ROBAXIN) 500 MG tablet Take 1 tablet (500 mg total) by mouth 2 (two) times daily. 11/22/22  Yes Darrick Grinder, PA-C  Multiple Vitamin (MULTIVITAMIN WITH MINERALS) TABS tablet Take 1 tablet by mouth daily.   Yes [provider]  Probiotic Product (PROBIOTIC PO) Take 1 capsule by mouth in the morning.   Yes [provider]  acetaminophen (TYLENOL) 500 MG tablet Take 500 mg by mouth every 6 (six) hours as needed (headaches/pain.).    [provider]  ARIPiprazole (ABILIFY) 2 MG tablet Take 2 mg by mouth every evening. 10/14/19   [provider]  ibuprofen (ADVIL) 200 MG tablet Take 600 mg by mouth every 8 (eight) hours as needed (for pain/headaches.).    [provider]  levocetirizine (XYZAL) 5 MG tablet Take 5 mg by mouth daily as needed for allergies.    [provider]  venlafaxine XR (EFFEXOR-XR) 37.5 MG 24 hr capsule Take 37.5 mg by mouth 3 (three) times a week. 2-3 days a week    [provider]    Physical Exam: Vitals:   11/22/22 0224 11/22/22 0505 11/22/22 0845 11/22/22 1026  BP: (!) 147/92 132/74 124/83 124/83  Pulse: (!) 127 87 88 88  Resp: 17 18  18   Temp: 98 F (36.7 C) 99.1 F (37.3 C) 98.2 F (36.8 C) 98.2 F (36.8 C)  TempSrc:  Oral  Oral  SpO2: 100% 100% 100%   Weight:    68.9 kg  Height:    5\' 1"  (1.549 m)    GENERAL: No acute distress.  Appears well.  HEENT: MMM.  Vision and hearing grossly intact.  NECK: Supple.  No apparent JVD.  RESP:  No IWOB. Good air movement bilaterally. CVS:  RRR. Heart sounds normal.  ABD/GI/GU: Bowel sounds present. Soft. Non tender.  MSK/EXT:  Moves extremities. No apparent deformity.  Tenderness to palpation in her thighs. SKIN: no apparent skin lesion or wound NEURO: Awake, alert and oriented appropriately.  No gross deficit.  PSYCH: Calm. Normal affect.   Personally Reviewed Radiological  Exams See HPI   Personally Reviewed Labs: CBC: Recent Labs  Lab 11/22/22 0235  WBC 12.5*  NEUTROABS 8.6*  HGB 12.2  HCT 37.7  MCV 85.9  PLT 353   Basic Metabolic Panel: Recent Labs  Lab 11/22/22 0235 11/22/22 0730  NA 135  --   K 3.5  --   CL 104  --   CO2 21*  --   GLUCOSE 119*  --   BUN 9  --   CREATININE 0.82  --   CALCIUM 8.3*  --   MG  --  2.2  PHOS  --  3.7   GFR: Estimated Creatinine Clearance: 81.7 mL/min (by C-G formula based on SCr of 0.82 mg/dL). Liver Function Tests: Recent Labs  Lab 11/22/22 0235  AST 242*  ALT 68*  ALKPHOS 75  BILITOT 0.6  PROT 6.8  ALBUMIN 3.7   No results for input(s): "LIPASE", "AMYLASE" in the last 168 hours. No results for input(s): "AMMONIA" in the last 168 hours. Coagulation Profile: No results for input(s): "INR", "PROTIME" in the last 168 hours. Cardiac Enzymes: Recent Labs  Lab 11/22/22 0235  CKTOTAL 29,854*   BNP (last 3 results) No results for input(s): "PROBNP" in the last 8760 hours. HbA1C: No results for input(s): "HGBA1C" in the last 72 hours. CBG: No results for input(s): "GLUCAP" in the last 168 hours. Lipid Profile: No results for input(s): "CHOL", "HDL", "LDLCALC", "TRIG", "CHOLHDL", "LDLDIRECT" in the last 72 hours. Thyroid Function Tests: No results for input(s): "TSH", "T4TOTAL", "FREET4", "T3FREE", "THYROIDAB" in the last 72 hours. Anemia Panel: No results for input(s): "VITAMINB12", "FOLATE", "FERRITIN", "TIBC", "IRON", "RETICCTPCT" in the last 72 hours. Urine analysis:    Component Value Date/Time   COLORURINE YELLOW 10/01/2016 2251   APPEARANCEUR HAZY (A) 10/01/2016 2251   LABSPEC 1.021 10/01/2016 2251   PHURINE 5.0 10/01/2016 2251   GLUCOSEU NEGATIVE 10/01/2016 2251   HGBUR SMALL (A) 10/01/2016 2251   BILIRUBINUR NEGATIVE 10/01/2016 2251   KETONESUR NEGATIVE 10/01/2016 2251   PROTEINUR NEGATIVE 10/01/2016 2251   UROBILINOGEN 0.2 09/09/2009 1141   NITRITE NEGATIVE 10/01/2016 2251    LEUKOCYTESUR NEGATIVE 10/01/2016 2251     Assessment and plan:  Principal Problem:   Exertional rhabdomyolysis Active Problems:   Elevated liver enzymes   Anxiety and depression   History of thyroid nodule    Exertional rhabdomyolysis: Likely from strenuous workout involving multiple lower body exercise on day 1.  CK elevated to 30,000.  Does not seem to be medication that could contribute.  Received LR 1 L in ED -Increase LR infusion from 100 to 250 cc an hour -Check aldolase. -Recheck CK and CMP in the evening. -Check thyroid panel given history of hypothyroidism but low suspicion -Robaxin and Tylenol for pain -Counseled on graduated exercise regimen in the future.  Elevated liver enzymes: Pattern consistent with rhabdo.  Does not drink alcohol. -Monitor  Anxiety and depression: Stable.  Taken off Abilify by PCP recently. -Continue home meds after med rec  Hyperglycemia: Mild -Check hemoglobin A1c  History of thyroid nodule s/p lobectomy in 03/2022 -Check TSH  DVT prophylaxis: Lovenox  Code Status: Full code Family Communication: Updated patient's sister and daughter at bedside  Consults called: None Admission status: Observation.   Almon Hercules MD [facility]  If 7PM-7AM, please contact night-coverage [email or web address]  11/22/2022, 11:39 AM

## 2022-11-22 NOTE — ED Provider Notes (Signed)
[place] EMERGENCY DEPARTMENT AT [facility] Provider Note   CSN: [id number] Arrival date & time: 11/22/22  0220     History  Chief Complaint  Patient presents with   Muscle Pain/Cramps    Rebecca Edwards is a 37 y.o. female.  Patient presents to the emergency department complaining of bilateral lower extremity thigh soreness/cramps/spasms.  Patient states she has not worked out at a gym in quite some time and on Wednesday night hired a Systems analyst and had a strenuous workout involving multiple lower body exercises.  She states that yesterday she began to have significant pain throughout the day which was unrelieved by over-the-counter medication.  She states her urine has been clear and she has been hydrating with lots of water and electrolyte solutions during the day.  Past medical history significant for GERD, palpitations, hiatal hernia, panic attacks  HPI     Home Medications Prior to Admission medications   Medication Sig Start Date End Date Taking? Authorizing Provider  methocarbamol (ROBAXIN) 500 MG tablet Take 1 tablet (500 mg total) by mouth 2 (two) times daily. 11/22/22  Yes Darrick Grinder, PA-C  acetaminophen (TYLENOL) 500 MG tablet Take 500 mg by mouth every 6 (six) hours as needed (headaches/pain.).    [provider]  ARIPiprazole (ABILIFY) 2 MG tablet Take 2 mg by mouth every evening. 10/14/19   [provider]  ascorbic acid (VITAMIN C) 500 MG tablet Take 500 mg by mouth every evening.    [provider]  Cholecalciferol (VITAMIN D) 50 MCG (2000 UT) tablet Take 2,000 Units by mouth in the morning.    [provider]  clonazePAM (KLONOPIN) 0.5 MG tablet Take 0.25 mg by mouth every evening.    [provider]  ibuprofen (ADVIL) 200 MG tablet Take 600 mg by mouth every 8 (eight) hours as needed (for pain/headaches.).    [provider]  levocetirizine (XYZAL) 5 MG tablet Take 5 mg by mouth daily as  needed for allergies.    [provider]  Multiple Vitamin (MULTIVITAMIN WITH MINERALS) TABS tablet Take 1 tablet by mouth daily.    [provider]  Probiotic Product (PROBIOTIC PO) Take 1 capsule by mouth in the morning.    [provider]  venlafaxine XR (EFFEXOR-XR) 37.5 MG 24 hr capsule Take 37.5 mg by mouth 3 (three) times a week. 2-3 days a week    [provider]      Allergies    Amoxicillin, Other, Hydrocodone-acetaminophen, Hydrocodone-acetaminophen, Propoxyphene, Propoxyphene n-acetaminophen, and Pseudoephedrine    Review of Systems   Review of Systems  Physical Exam Updated Vital Signs BP 132/74 (BP Location: Right Arm)   Pulse 87   Temp 99.1 F (37.3 C) (Oral)   Resp 18   SpO2 100%  Physical Exam Vitals and nursing note reviewed.  HENT:     Head: Normocephalic and atraumatic.  Eyes:     Conjunctiva/sclera: Conjunctivae normal.  Cardiovascular:     Rate and Rhythm: Normal rate.  Pulmonary:     Effort: Pulmonary effort is normal. No respiratory distress.  Abdominal:     Palpations: Abdomen is soft.     Tenderness: There is no abdominal tenderness.  Musculoskeletal:        General: Tenderness present. No swelling, deformity or signs of injury.     Cervical back: Normal range of motion.     Comments: Generalized tenderness to palpation of bilateral thighs  Skin:    General:  Skin is dry.  Neurological:     Mental Status: She is alert.  Psychiatric:        Speech: Speech normal.        Behavior: Behavior normal.     ED Results / Procedures / Treatments   Labs (all labs ordered are listed, but only abnormal results are displayed) Labs Reviewed  CBC WITH DIFFERENTIAL/PLATELET - Abnormal; Notable for the following components:      Result Value   WBC 12.5 (*)    Neutro Abs 8.6 (*)    All other components within normal limits  COMPREHENSIVE METABOLIC PANEL - Abnormal; Notable for the following components:   CO2 21 (*)     Glucose, Bld 119 (*)    Calcium 8.3 (*)    AST 242 (*)    ALT 68 (*)    All other components within normal limits  HCG, SERUM, QUALITATIVE  CK    EKG None  Radiology No results found.  Procedures Procedures    Medications Ordered in ED Medications  ibuprofen (ADVIL) tablet 600 mg (has no administration in time range)  methocarbamol (ROBAXIN) tablet 500 mg (500 mg Oral Given 11/22/22 0535)  sodium chloride 0.9 % bolus 1,000 mL (1,000 mLs Intravenous New Bag/Given 11/22/22 0535)    ED Course/ Medical Decision Making/ A&P                                 Medical Decision Making Amount and/or Complexity of Data Reviewed Labs: ordered.  Risk Prescription drug management.   This patient presents to the ED for concern of muscle soreness, this involves an extensive number of treatment options, and is a complaint that carries with it a high risk of complications and morbidity.  The differential diagnosis includes rhabdomyolysis, cramps, muscle soreness   Co morbidities that complicate the patient evaluation  Panic attacks, palpitations, GERD   Lab Tests:  I Ordered, and personally interpreted labs.  The pertinent results include: Isolated elevation in AST at 242, WBC 12.5, negative pregnancy test, CK in process   Problem List / ED Course / Critical interventions / Medication management   I ordered medication including normal saline for fluid resuscitation, Robaxin for muscle cramps, ibuprofen for muscle pain Reevaluation of the patient after these medicines showed that the patient improved I have reviewed the patients home medicines and have made adjustments as needed   Test / Admission - Considered:  Patient care being transferred to [facility], PA-C at shift handoff. Disposition pending CK results.          Final Clinical Impression(s) / ED Diagnoses Final diagnoses:  Muscle soreness    Rx / DC Orders ED Discharge Orders          Ordered     methocarbamol (ROBAXIN) 500 MG tablet  2 times daily        11/22/22 0634              Darrick Grinder, PA-C 11/22/22 8295    Gilda Crease, MD 11/22/22 [phone]

## 2022-11-23 DIAGNOSIS — Z8639 Personal history of other endocrine, nutritional and metabolic disease: Secondary | ICD-10-CM | POA: Diagnosis not present

## 2022-11-23 DIAGNOSIS — F419 Anxiety disorder, unspecified: Secondary | ICD-10-CM | POA: Diagnosis not present

## 2022-11-23 DIAGNOSIS — R748 Abnormal levels of other serum enzymes: Secondary | ICD-10-CM | POA: Diagnosis not present

## 2022-11-23 DIAGNOSIS — M6282 Rhabdomyolysis: Secondary | ICD-10-CM | POA: Diagnosis not present

## 2022-11-23 LAB — COMPREHENSIVE METABOLIC PANEL
ALT: 77 U/L — ABNORMAL HIGH (ref 0–44)
AST: 261 U/L — ABNORMAL HIGH (ref 15–41)
Albumin: 3.1 g/dL — ABNORMAL LOW (ref 3.5–5.0)
Alkaline Phosphatase: 61 U/L (ref 38–126)
Anion gap: 8 (ref 5–15)
BUN: 5 mg/dL — ABNORMAL LOW (ref 6–20)
CO2: 24 mmol/L (ref 22–32)
Calcium: 8.2 mg/dL — ABNORMAL LOW (ref 8.9–10.3)
Chloride: 105 mmol/L (ref 98–111)
Creatinine, Ser: 0.77 mg/dL (ref 0.44–1.00)
GFR, Estimated: >60 mL/min (ref >60)
Glucose, Bld: 109 mg/dL — ABNORMAL HIGH (ref 70–99)
Potassium: 3.7 mmol/L (ref 3.5–5.1)
Sodium: 137 mmol/L (ref 135–145)
Total Bilirubin: 0.4 mg/dL (ref 0.3–1.2)
Total Protein: 5.9 g/dL — ABNORMAL LOW (ref 6.5–8.1)

## 2022-11-23 LAB — CK
Total CK: 34211 U/L — ABNORMAL HIGH (ref 38–234)
Total CK: 36736 U/L — ABNORMAL HIGH (ref 38–234)

## 2022-11-23 LAB — CBC
HCT: 31.4 % — ABNORMAL LOW (ref 36.0–46.0)
Hemoglobin: 10.1 g/dL — ABNORMAL LOW (ref 12.0–15.0)
MCH: 26.9 pg (ref 26.0–34.0)
MCHC: 32.2 g/dL (ref 30.0–36.0)
MCV: 83.5 fL (ref 80.0–100.0)
Platelets: 280 10*3/uL (ref 150–400)
RBC: 3.76 MIL/uL — ABNORMAL LOW (ref 3.87–5.11)
RDW: 15 % (ref 11.5–15.5)
WBC: 7.7 10*3/uL (ref 4.0–10.5)
nRBC: 0 % (ref 0.0–0.2)

## 2022-11-23 LAB — MAGNESIUM: Magnesium: 2 mg/dL (ref 1.7–2.4)

## 2022-11-23 LAB — ALDOLASE: Aldolase: 143.5 U/L — ABNORMAL HIGH (ref 3.3–10.3)

## 2022-11-23 MED ORDER — LACTATED RINGERS IV BOLUS
2000.0000 mL | Freq: Once | INTRAVENOUS | Status: AC
  Administered 2022-11-23: 2000 mL via INTRAVENOUS

## 2022-11-23 MED ORDER — CLONAZEPAM 0.25 MG PO TBDP
0.2500 mg | ORAL_TABLET | Freq: Every day | ORAL | Status: DC
  Administered 2022-11-23 – 2022-11-27 (×5): 0.25 mg via ORAL
  Filled 2022-11-23 (×5): qty 1

## 2022-11-23 MED ORDER — LACTATED RINGERS IV SOLN: INTRAVENOUS | Status: DC

## 2022-11-23 MED ORDER — LEVOCETIRIZINE DIHYDROCHLORIDE 5 MG PO TABS: 5.0000 mg | ORAL_TABLET | Freq: Every day | ORAL | Status: DC | PRN

## 2022-11-23 MED ORDER — IBUPROFEN 200 MG PO TABS
600.0000 mg | ORAL_TABLET | Freq: Three times a day (TID) | ORAL | Status: DC | PRN
  Administered 2022-11-24: 600 mg via ORAL
  Filled 2022-11-23: qty 3

## 2022-11-23 MED ORDER — LORATADINE 10 MG PO TABS: 10.0000 mg | ORAL_TABLET | Freq: Every day | ORAL | Status: DC | PRN

## 2022-11-23 NOTE — Plan of Care (Signed)

## 2022-11-23 NOTE — Progress Notes (Signed)
PROGRESS NOTE  Rebecca Edwards OAC:[id number] DOB: [birth year]   PCP: Mila Palmer, MD  Patient is from: Home.  DOA: 11/22/2022 LOS: 1  Chief complaints Chief Complaint  Patient presents with   Muscle Pain/Cramps     Brief Narrative / Interim history:  37 year old F with PMH of anxiety, depression and thyroid nodule s/p lobectomy in 03/2022 presenting with bilateral leg cramping and aching, and admitted for exertional rhabdomyolysis with CK elevated to 30,000.  On IV fluid.  Subjective: Seen and examined earlier this morning.  No major events overnight of this morning.  Reports improvement in pain.  A little tired and sore when she gets up to move.  Denies swelling, numbness or tingling.  Denies shortness of breath.   Objective: Vitals:   11/22/22 2026 11/23/22 0013 11/23/22 0555 11/23/22 0741  BP: 123/79 125/86 115/77 121/79  Pulse: 99 79 80 82  Resp: 18 18 18    Temp: 98.8 F (37.1 C) 98.2 F (36.8 C) (!) 97.5 F (36.4 C) 98.9 F (37.2 C)  TempSrc: Oral Oral Oral Oral  SpO2: 99% 100% 99% 99%  Weight:      Height:        Examination:  GENERAL: No apparent distress.  Nontoxic. HEENT: MMM.  Vision and hearing grossly intact.  NECK: Supple.  No apparent JVD.  RESP:  No IWOB.  Fair aeration bilaterally. CVS:  RRR. Heart sounds normal.  ABD/GI/GU: BS+. Abd soft, NTND.  MSK/EXT:  Moves extremities. No apparent deformity.  Mild tenderness in thighs. SKIN: no apparent skin lesion or wound NEURO: Awake, alert and oriented appropriately.  No apparent focal neuro deficit. PSYCH: Calm. Normal affect.   Procedures:  None  Microbiology summarized: None  Assessment and plan: Principal Problem:   Exertional rhabdomyolysis Active Problems:   Elevated liver enzymes   Anxiety and depression   History of thyroid nodule  Exertional rhabdomyolysis: Likely from strenuous workout involving multiple lower body exercise on day 1.  CK elevated to 30k> 36K. Does not seem to  be medication that could contribute.  Received LR 1 L in ED. no signs of compartment syndrome. -LR bolus 2 L followed by 300 cc an hour -Follow aldolase -Monitor CK -Robaxin and Tylenol for pain -Counseled on graduated exercise regimen in the future.   Elevated liver enzymes: Pattern consistent with rhabdo.  Does not drink alcohol.  Stable. -Monitor   Anxiety and depression: Stable.  Taken off Abilify and Effexor by PCP recently. -Continue home Klonopin at night   Hyperglycemia: Resolved.  A1c 5.6%.   History of thyroid nodule s/p lobectomy in 03/2022.  TSH normal.  Body mass index is 28.72 kg/m.           DVT prophylaxis:  enoxaparin (LOVENOX) injection 40 mg Start: 11/22/22 1230  Code Status: Full code Family Communication: Updated patient's mother and daughter at bedside Level of care: Med-Surg Status is: Inpatient Remains inpatient appropriate because: Exertional rhabdomyolysis   Final disposition: Home Consultants:  None  35 minutes with more than 50% spent in reviewing records, counseling patient/family and coordinating care.   Sch Meds:  Scheduled Meds:  clonazepam  0.25 mg Oral QHS   enoxaparin (LOVENOX) injection  40 mg Subcutaneous Q24H   Continuous Infusions:  lactated ringers 300 mL/hr at 11/23/22 1202   PRN Meds:.acetaminophen **OR** acetaminophen, ibuprofen, loratadine, methocarbamol, polyethylene glycol, senna-docusate  Antimicrobials: Anti-infectives (From admission, onward)    None        I have personally reviewed the following labs  and images: CBC: Recent Labs  Lab 11/22/22 0235 11/22/22 1538 11/23/22 0655  WBC 12.5* 11.0* 7.7  NEUTROABS 8.6*  --   --   HGB 12.2 10.8* 10.1*  HCT 37.7 33.7* 31.4*  MCV 85.9 84.3 83.5  PLT 353 321 280   BMP &GFR Recent Labs  Lab 11/22/22 0235 11/22/22 0730 11/22/22 1538 11/23/22 0655  NA 135  --  138 137  K 3.5  --  3.7 3.7  CL 104  --  109 105  CO2 21*  --  21* 24  GLUCOSE 119*  --   94 109*  BUN 9  --  8 5*  CREATININE 0.82  --  0.84 0.77  CALCIUM 8.3*  --  8.2* 8.2*  MG  --  2.2 2.2 2.0  PHOS  --  3.7  --   --    Estimated Creatinine Clearance: 83.8 mL/min (by C-G formula based on SCr of 0.77 mg/dL). Liver & Pancreas: Recent Labs  Lab 11/22/22 0235 11/22/22 1538 11/23/22 0655  AST 242* 286* 261*  ALT 68* 73* 77*  ALKPHOS 75 69 61  BILITOT 0.6 0.7 0.4  PROT 6.8 5.9* 5.9*  ALBUMIN 3.7 3.1* 3.1*   No results for input(s): "LIPASE", "AMYLASE" in the last 168 hours. No results for input(s): "AMMONIA" in the last 168 hours. Diabetic: Recent Labs    11/22/22 1538  HGBA1C 5.6   No results for input(s): "GLUCAP" in the last 168 hours. Cardiac Enzymes: Recent Labs  Lab 11/22/22 0235 11/22/22 1538 11/23/22 0655 11/23/22 1019  CKTOTAL 91,478* 32,971* 34,211* 36,736*   No results for input(s): "PROBNP" in the last 8760 hours. Coagulation Profile: No results for input(s): "INR", "PROTIME" in the last 168 hours. Thyroid Function Tests: Recent Labs    11/22/22 1538  TSH 3.848   Lipid Profile: No results for input(s): "CHOL", "HDL", "LDLCALC", "TRIG", "CHOLHDL", "LDLDIRECT" in the last 72 hours. Anemia Panel: No results for input(s): "VITAMINB12", "FOLATE", "FERRITIN", "TIBC", "IRON", "RETICCTPCT" in the last 72 hours. Urine analysis:    Component Value Date/Time   COLORURINE YELLOW 10/01/2016 2251   APPEARANCEUR HAZY (A) 10/01/2016 2251   LABSPEC 1.021 10/01/2016 2251   PHURINE 5.0 10/01/2016 2251   GLUCOSEU NEGATIVE 10/01/2016 2251   HGBUR SMALL (A) 10/01/2016 2251   BILIRUBINUR NEGATIVE 10/01/2016 2251   KETONESUR NEGATIVE 10/01/2016 2251   PROTEINUR NEGATIVE 10/01/2016 2251   UROBILINOGEN 0.2 09/09/2009 1141   NITRITE NEGATIVE 10/01/2016 2251   LEUKOCYTESUR NEGATIVE 10/01/2016 2251   Sepsis Labs: Invalid input(s): "PROCALCITONIN", "LACTICIDVEN"  Microbiology: No results found for this or any previous visit (from the past 240  hour(s)).  Radiology Studies: No results found.    Jaevion Goto T. Whitt Auletta [facility]  If 7PM-7AM, please contact night-coverage [email or web address] 11/23/2022, 3:12 PM

## 2022-11-24 DIAGNOSIS — Z8639 Personal history of other endocrine, nutritional and metabolic disease: Secondary | ICD-10-CM | POA: Diagnosis not present

## 2022-11-24 DIAGNOSIS — R748 Abnormal levels of other serum enzymes: Secondary | ICD-10-CM | POA: Diagnosis not present

## 2022-11-24 DIAGNOSIS — M6282 Rhabdomyolysis: Secondary | ICD-10-CM | POA: Diagnosis not present

## 2022-11-24 DIAGNOSIS — F419 Anxiety disorder, unspecified: Secondary | ICD-10-CM | POA: Diagnosis not present

## 2022-11-24 LAB — COMPREHENSIVE METABOLIC PANEL
ALT: 85 U/L — ABNORMAL HIGH (ref 0–44)
AST: 237 U/L — ABNORMAL HIGH (ref 15–41)
Albumin: 3.4 g/dL — ABNORMAL LOW (ref 3.5–5.0)
Alkaline Phosphatase: 64 U/L (ref 38–126)
Anion gap: 8 (ref 5–15)
BUN: <5 mg/dL — ABNORMAL LOW (ref 6–20)
CO2: 24 mmol/L (ref 22–32)
Calcium: 8.8 mg/dL — ABNORMAL LOW (ref 8.9–10.3)
Chloride: 106 mmol/L (ref 98–111)
Creatinine, Ser: 0.94 mg/dL (ref 0.44–1.00)
GFR, Estimated: >60 mL/min (ref >60)
Glucose, Bld: 106 mg/dL — ABNORMAL HIGH (ref 70–99)
Potassium: 3.7 mmol/L (ref 3.5–5.1)
Sodium: 138 mmol/L (ref 135–145)
Total Bilirubin: 0.7 mg/dL (ref 0.3–1.2)
Total Protein: 6.6 g/dL (ref 6.5–8.1)

## 2022-11-24 LAB — CK: Total CK: 27360 U/L — ABNORMAL HIGH (ref 38–234)

## 2022-11-24 MED ORDER — RIVAROXABAN 10 MG PO TABS: 10.0000 mg | ORAL_TABLET | Freq: Every day | ORAL | Status: DC

## 2022-11-24 MED ORDER — BUTALBITAL-APAP-CAFFEINE 50-325-40 MG PO TABS
1.0000 | ORAL_TABLET | Freq: Four times a day (QID) | ORAL | Status: AC | PRN
  Administered 2022-11-24: 1 via ORAL
  Filled 2022-11-24: qty 1

## 2022-11-24 NOTE — Plan of Care (Signed)
°  Problem: Education: Goal: Knowledge of General Education information will improve Description: Including pain rating scale, medication(s)/side effects and non-pharmacologic comfort measures Outcome: Progressing   Problem: Health Behavior/Discharge Planning: Goal: Ability to manage health-related needs will improve Outcome: Progressing   Problem: Clinical Measurements: Goal: Ability to maintain clinical measurements within normal limits will improve Outcome: Progressing Goal: Will remain free from infection Outcome: Progressing Goal: Diagnostic test results will improve Outcome: Progressing Goal: Respiratory complications will improve Outcome: Progressing Goal: Cardiovascular complication will be avoided Outcome: Progressing   Problem: Activity: Goal: Risk for activity intolerance will decrease Outcome: Progressing   Problem: Nutrition: Goal: Adequate nutrition will be maintained Outcome: Progressing   Problem: Nutrition: Goal: Adequate nutrition will be maintained Outcome: Progressing

## 2022-11-24 NOTE — Progress Notes (Signed)
PROGRESS NOTE  Rebecca Edwards PPI:[id number] DOB: June 17, 1985   PCP: Mila Palmer, MD  Patient is from: Home.  DOA: 11/22/2022 LOS: 2  Chief complaints Chief Complaint  Patient presents with   Muscle Pain/Cramps     Brief Narrative / Interim history:  37 year old F with PMH of anxiety, depression and thyroid nodule s/p lobectomy in 03/2022 presenting with bilateral leg cramping and aching, and admitted for exertional rhabdomyolysis with CK elevated to 30,000. CK peaked at 36,000 and started trending down.  On rigorous IV fluid.  Subjective: Seen and examined earlier this morning.  No major events overnight of this morning.  No complaints.  Pain improved.  No swelling, numbness or tingling.  No shortness of breath.  Going to bathroom a lot.  Objective: Vitals:   11/23/22 1602 11/23/22 2012 11/24/22 0453 11/24/22 0809  BP: 137/85 (!) 124/92 (!) 135/90 127/83  Pulse: 88 91 95 95  Resp:  18 16 18   Temp: 98.5 F (36.9 C) 98.6 F (37 C) 98.9 F (37.2 C) 99.1 F (37.3 C)  TempSrc:  Oral Oral Oral  SpO2: 100% 99% 99% 100%  Weight:      Height:        Examination:  GENERAL: No apparent distress.  Nontoxic. HEENT: MMM.  Vision and hearing grossly intact.  NECK: Supple.  No apparent JVD.  RESP:  No IWOB.  Fair aeration bilaterally. CVS:  RRR. Heart sounds normal.  ABD/GI/GU: BS+. Abd soft, NTND.  MSK/EXT:  Moves extremities. No apparent deformity.  No edema, SKIN: no apparent skin lesion or wound NEURO: Awake, alert and oriented appropriately.  No apparent focal neuro deficit. PSYCH: Calm. Normal affect.   Procedures:  None  Microbiology summarized: None  Assessment and plan: Principal Problem:   Exertional rhabdomyolysis Active Problems:   Elevated liver enzymes   Anxiety and depression   History of thyroid nodule  Exertional rhabdomyolysis: Likely from strenuous workout involving multiple lower body exercise on day 1.  CK 30k. Does not seem to be  medication that could contribute.  Received LR 1 L in ED. No signs of compartment syndrome.  CK peaked at [facility] and started trending down. -Continue LR at 300 cc an hour -Follow aldolase -Monitor CK and CMP -Robaxin and Tylenol for pain -Counseled on graduated exercise regimen in the future.   Elevated liver enzymes: Pattern consistent with rhabdo.  Does not drink alcohol.  Stable. -Monitor   Anxiety and depression: Stable.  Taken off Abilify and Effexor by PCP recently. -Continue home Klonopin at night   Hyperglycemia: Resolved.  A1c 5.6%.   History of thyroid nodule s/p lobectomy in 03/2022.  TSH normal.  Body mass index is 28.72 kg/m.           DVT prophylaxis:  Place and maintain sequential compression device Start: 11/24/22 0804  Code Status: Full code Family Communication: Daughter at bedside but sleeping. Level of care: Med-Surg Status is: Inpatient Remains inpatient appropriate because: Exertional rhabdomyolysis   Final disposition: Home Consultants:  None  35 minutes with more than 50% spent in reviewing records, counseling patient/family and coordinating care.   Sch Meds:  Scheduled Meds:  clonazepam  0.25 mg Oral QHS   Continuous Infusions:  lactated ringers Stopped (11/24/22 0442)   PRN Meds:.acetaminophen **OR** acetaminophen, ibuprofen, loratadine, methocarbamol, polyethylene glycol, senna-docusate  Antimicrobials: Anti-infectives (From admission, onward)    None        I have personally reviewed the following labs and images: CBC: Recent Labs  Lab 11/22/22 0235 11/22/22 1538 11/23/22 0655  WBC 12.5* 11.0* 7.7  NEUTROABS 8.6*  --   --   HGB 12.2 10.8* 10.1*  HCT 37.7 33.7* 31.4*  MCV 85.9 84.3 83.5  PLT 353 321 280   BMP &GFR Recent Labs  Lab 11/22/22 0235 11/22/22 0730 11/22/22 1538 11/23/22 0655 11/24/22 0948  NA 135  --  138 137 138  K 3.5  --  3.7 3.7 3.7  CL 104  --  109 105 106  CO2 21*  --  21* 24 24  GLUCOSE  119*  --  94 109* 106*  BUN 9  --  8 5* <5*  CREATININE 0.82  --  0.84 0.77 0.94  CALCIUM 8.3*  --  8.2* 8.2* 8.8*  MG  --  2.2 2.2 2.0  --   PHOS  --  3.7  --   --   --    Estimated Creatinine Clearance: 71.3 mL/min (by C-G formula based on SCr of 0.94 mg/dL). Liver & Pancreas: Recent Labs  Lab 11/22/22 0235 11/22/22 1538 11/23/22 0655 11/24/22 0948  AST 242* 286* 261* 237*  ALT 68* 73* 77* 85*  ALKPHOS 75 69 61 64  BILITOT 0.6 0.7 0.4 0.7  PROT 6.8 5.9* 5.9* 6.6  ALBUMIN 3.7 3.1* 3.1* 3.4*   No results for input(s): "LIPASE", "AMYLASE" in the last 168 hours. No results for input(s): "AMMONIA" in the last 168 hours. Diabetic: Recent Labs    11/22/22 1538  HGBA1C 5.6   No results for input(s): "GLUCAP" in the last 168 hours. Cardiac Enzymes: Recent Labs  Lab 11/22/22 0235 11/22/22 1538 11/23/22 0655 11/23/22 1019 11/24/22 0948  CKTOTAL 16,109* 32,971* 34,211* 60,454* 27,360*   No results for input(s): "PROBNP" in the last 8760 hours. Coagulation Profile: No results for input(s): "INR", "PROTIME" in the last 168 hours. Thyroid Function Tests: Recent Labs    11/22/22 1538  TSH 3.848   Lipid Profile: No results for input(s): "CHOL", "HDL", "LDLCALC", "TRIG", "CHOLHDL", "LDLDIRECT" in the last 72 hours. Anemia Panel: No results for input(s): "VITAMINB12", "FOLATE", "FERRITIN", "TIBC", "IRON", "RETICCTPCT" in the last 72 hours. Urine analysis:    Component Value Date/Time   COLORURINE YELLOW 10/01/2016 2251   APPEARANCEUR HAZY (A) 10/01/2016 2251   LABSPEC 1.021 10/01/2016 2251   PHURINE 5.0 10/01/2016 2251   GLUCOSEU NEGATIVE 10/01/2016 2251   HGBUR SMALL (A) 10/01/2016 2251   BILIRUBINUR NEGATIVE 10/01/2016 2251   KETONESUR NEGATIVE 10/01/2016 2251   PROTEINUR NEGATIVE 10/01/2016 2251   UROBILINOGEN 0.2 09/09/2009 1141   NITRITE NEGATIVE 10/01/2016 2251   LEUKOCYTESUR NEGATIVE 10/01/2016 2251   Sepsis Labs: Invalid input(s): "PROCALCITONIN",  "LACTICIDVEN"  Microbiology: No results found for this or any previous visit (from the past 240 hour(s)).  Radiology Studies: No results found.    Brailen Macneal T. Lynnzie Blackson [facility]  If 7PM-7AM, please contact night-coverage [email or web address] 11/24/2022, 2:14 PM

## 2022-11-25 DIAGNOSIS — M6282 Rhabdomyolysis: Secondary | ICD-10-CM | POA: Diagnosis not present

## 2022-11-25 DIAGNOSIS — R748 Abnormal levels of other serum enzymes: Secondary | ICD-10-CM | POA: Diagnosis not present

## 2022-11-25 DIAGNOSIS — Z8639 Personal history of other endocrine, nutritional and metabolic disease: Secondary | ICD-10-CM | POA: Diagnosis not present

## 2022-11-25 DIAGNOSIS — F419 Anxiety disorder, unspecified: Secondary | ICD-10-CM | POA: Diagnosis not present

## 2022-11-25 LAB — COMPREHENSIVE METABOLIC PANEL
ALT: 85 U/L — ABNORMAL HIGH (ref 0–44)
AST: 200 U/L — ABNORMAL HIGH (ref 15–41)
Albumin: 3.2 g/dL — ABNORMAL LOW (ref 3.5–5.0)
Alkaline Phosphatase: 57 U/L (ref 38–126)
Anion gap: 9 (ref 5–15)
BUN: 6 mg/dL (ref 6–20)
CO2: 21 mmol/L — ABNORMAL LOW (ref 22–32)
Calcium: 8.4 mg/dL — ABNORMAL LOW (ref 8.9–10.3)
Chloride: 104 mmol/L (ref 98–111)
Creatinine, Ser: 0.79 mg/dL (ref 0.44–1.00)
GFR, Estimated: >60 mL/min (ref >60)
Glucose, Bld: 95 mg/dL (ref 70–99)
Potassium: 3.3 mmol/L — ABNORMAL LOW (ref 3.5–5.1)
Sodium: 134 mmol/L — ABNORMAL LOW (ref 135–145)
Total Bilirubin: 0.6 mg/dL (ref 0.3–1.2)
Total Protein: 6.2 g/dL — ABNORMAL LOW (ref 6.5–8.1)

## 2022-11-25 LAB — CBC
HCT: 32.1 % — ABNORMAL LOW (ref 36.0–46.0)
Hemoglobin: 10.4 g/dL — ABNORMAL LOW (ref 12.0–15.0)
MCH: 27.8 pg (ref 26.0–34.0)
MCHC: 32.4 g/dL (ref 30.0–36.0)
MCV: 85.8 fL (ref 80.0–100.0)
Platelets: 288 10*3/uL (ref 150–400)
RBC: 3.74 MIL/uL — ABNORMAL LOW (ref 3.87–5.11)
RDW: 14.7 % (ref 11.5–15.5)
WBC: 8.8 10*3/uL (ref 4.0–10.5)
nRBC: 0 % (ref 0.0–0.2)

## 2022-11-25 LAB — CK: Total CK: 15180 U/L — ABNORMAL HIGH (ref 38–234)

## 2022-11-25 MED ORDER — POTASSIUM CHLORIDE CRYS ER 20 MEQ PO TBCR
40.0000 meq | EXTENDED_RELEASE_TABLET | ORAL | Status: AC
  Administered 2022-11-25 (×2): 40 meq via ORAL
  Filled 2022-11-25 (×2): qty 2

## 2022-11-25 NOTE — Progress Notes (Signed)
PROGRESS NOTE  Rebecca Edwards ZOX:[id number] DOB: 05/10/1985   PCP: Mila Palmer, MD  Patient is from: Home.  DOA: 11/22/2022 LOS: 3  Chief complaints Chief Complaint  Patient presents with   Muscle Pain/Cramps     Brief Narrative / Interim history:  37 year old F with PMH of anxiety, depression and thyroid nodule s/p lobectomy in 03/2022 presenting with bilateral leg cramping and aching, and admitted for exertional rhabdomyolysis with CK elevated to 30,000. CK peaked at 36,000 and started trending down.  On rigorous IV fluid.  Subjective: Seen and examined earlier this morning.  No major events overnight of this morning.  No complaints.  CK improved.  Objective: Vitals:   11/24/22 1645 11/24/22 1944 11/25/22 0511 11/25/22 0855  BP: 128/75 137/79 121/87 118/80  Pulse: 88 86 73 79  Resp: 18 18 18 18   Temp: 98.6 F (37 C) 98.3 F (36.8 C) 98.2 F (36.8 C) 98.5 F (36.9 C)  TempSrc: Oral Oral Oral Oral  SpO2: 100% 98% 99% 99%  Weight:      Height:        Examination:  GENERAL: No apparent distress.  Nontoxic. HEENT: MMM.  Vision and hearing grossly intact.  NECK: Supple.  No apparent JVD.  RESP:  No IWOB.  Fair aeration bilaterally. CVS:  RRR. Heart sounds normal.  ABD/GI/GU: BS+. Abd soft, NTND.  MSK/EXT:  Moves extremities. No apparent deformity.  No edema, SKIN: no apparent skin lesion or wound NEURO: Awake, alert and oriented appropriately.  No apparent focal neuro deficit. PSYCH: Calm. Normal affect.   Procedures:  None  Microbiology summarized: None  Assessment and plan: Principal Problem:   Exertional rhabdomyolysis Active Problems:   Elevated liver enzymes   Anxiety and depression   History of thyroid nodule  Exertional rhabdomyolysis: Likely from strenuous workout involving multiple lower body exercise on day 1.  CK 30k.  Aldolase elevated as well.  Does not seem to be medication that could contribute. No signs of compartment syndrome.  CK  peaked at [facility] and started trending down.  LFT improving. -Decrease LR to 200 cc an hour -Monitor CK and CMP -Robaxin and Tylenol for pain -Counseled on graduated exercise regimen in the future.   Elevated liver enzymes: Pattern consistent with rhabdo.  Does not drink alcohol.  Improving. -Monitor   Anxiety and depression: Stable.  Taken off Abilify and Effexor by PCP recently. -Continue home Klonopin at night   Hyperglycemia: Resolved.  A1c 5.6%.  Hypokalemia -Monitor replenish as appropriate.   History of thyroid nodule s/p lobectomy in 03/2022.  TSH normal.  Body mass index is 28.72 kg/m.           DVT prophylaxis:  Place and maintain sequential compression device Start: 11/24/22 0804  Code Status: Full code Family Communication: None at bedside today. Level of care: Med-Surg Status is: Inpatient Remains inpatient appropriate because: Exertional rhabdomyolysis   Final disposition: Home in the next 24 to 48 hours. Consultants:  None  35 minutes with more than 50% spent in reviewing records, counseling patient/family and coordinating care.   Sch Meds:  Scheduled Meds:  clonazepam  0.25 mg Oral QHS   potassium chloride  40 mEq Oral Q4H   Continuous Infusions:  lactated ringers 200 mL/hr at 11/25/22 0804   PRN Meds:.acetaminophen **OR** acetaminophen, ibuprofen, loratadine, methocarbamol, polyethylene glycol, senna-docusate  Antimicrobials: Anti-infectives (From admission, onward)    None        I have personally reviewed the following labs and images:  CBC: Recent Labs  Lab 11/22/22 0235 11/22/22 1538 11/23/22 0655 11/25/22 0431  WBC 12.5* 11.0* 7.7 8.8  NEUTROABS 8.6*  --   --   --   HGB 12.2 10.8* 10.1* 10.4*  HCT 37.7 33.7* 31.4* 32.1*  MCV 85.9 84.3 83.5 85.8  PLT 353 321 280 288   BMP &GFR Recent Labs  Lab 11/22/22 0235 11/22/22 0730 11/22/22 1538 11/23/22 0655 11/24/22 0948 11/25/22 0431  NA 135  --  138 137 138 134*  K 3.5   --  3.7 3.7 3.7 3.3*  CL 104  --  109 105 106 104  CO2 21*  --  21* 24 24 21*  GLUCOSE 119*  --  94 109* 106* 95  BUN 9  --  8 5* <5* 6  CREATININE 0.82  --  0.84 0.77 0.94 0.79  CALCIUM 8.3*  --  8.2* 8.2* 8.8* 8.4*  MG  --  2.2 2.2 2.0  --   --   PHOS  --  3.7  --   --   --   --    Estimated Creatinine Clearance: 83.8 mL/min (by C-G formula based on SCr of 0.79 mg/dL). Liver & Pancreas: Recent Labs  Lab 11/22/22 0235 11/22/22 1538 11/23/22 0655 11/24/22 0948 11/25/22 0431  AST 242* 286* 261* 237* 200*  ALT 68* 73* 77* 85* 85*  ALKPHOS 75 69 61 64 57  BILITOT 0.6 0.7 0.4 0.7 0.6  PROT 6.8 5.9* 5.9* 6.6 6.2*  ALBUMIN 3.7 3.1* 3.1* 3.4* 3.2*   No results for input(s): "LIPASE", "AMYLASE" in the last 168 hours. No results for input(s): "AMMONIA" in the last 168 hours. Diabetic: Recent Labs    11/22/22 1538  HGBA1C 5.6   No results for input(s): "GLUCAP" in the last 168 hours. Cardiac Enzymes: Recent Labs  Lab 11/22/22 1538 11/23/22 0655 11/23/22 1019 11/24/22 0948 11/25/22 0431  CKTOTAL 32,971* 34,211* 36,736* 27,360* 15,180*   No results for input(s): "PROBNP" in the last 8760 hours. Coagulation Profile: No results for input(s): "INR", "PROTIME" in the last 168 hours. Thyroid Function Tests: Recent Labs    11/22/22 1538  TSH 3.848   Lipid Profile: No results for input(s): "CHOL", "HDL", "LDLCALC", "TRIG", "CHOLHDL", "LDLDIRECT" in the last 72 hours. Anemia Panel: No results for input(s): "VITAMINB12", "FOLATE", "FERRITIN", "TIBC", "IRON", "RETICCTPCT" in the last 72 hours. Urine analysis:    Component Value Date/Time   COLORURINE YELLOW 10/01/2016 2251   APPEARANCEUR HAZY (A) 10/01/2016 2251   LABSPEC 1.021 10/01/2016 2251   PHURINE 5.0 10/01/2016 2251   GLUCOSEU NEGATIVE 10/01/2016 2251   HGBUR SMALL (A) 10/01/2016 2251   BILIRUBINUR NEGATIVE 10/01/2016 2251   KETONESUR NEGATIVE 10/01/2016 2251   PROTEINUR NEGATIVE 10/01/2016 2251   UROBILINOGEN  0.2 09/09/2009 1141   NITRITE NEGATIVE 10/01/2016 2251   LEUKOCYTESUR NEGATIVE 10/01/2016 2251   Sepsis Labs: Invalid input(s): "PROCALCITONIN", "LACTICIDVEN"  Microbiology: No results found for this or any previous visit (from the past 240 hour(s)).  Radiology Studies: No results found.    Mattilyn Crites T. Kaija Kovacevic [facility]  If 7PM-7AM, please contact night-coverage [email or web address] 11/25/2022, 12:13 PM

## 2022-11-25 NOTE — Plan of Care (Signed)

## 2022-11-25 NOTE — Plan of Care (Signed)

## 2022-11-26 DIAGNOSIS — R748 Abnormal levels of other serum enzymes: Secondary | ICD-10-CM | POA: Diagnosis not present

## 2022-11-26 DIAGNOSIS — Z8639 Personal history of other endocrine, nutritional and metabolic disease: Secondary | ICD-10-CM | POA: Diagnosis not present

## 2022-11-26 DIAGNOSIS — M6282 Rhabdomyolysis: Secondary | ICD-10-CM | POA: Diagnosis not present

## 2022-11-26 DIAGNOSIS — F419 Anxiety disorder, unspecified: Secondary | ICD-10-CM | POA: Diagnosis not present

## 2022-11-26 LAB — COMPREHENSIVE METABOLIC PANEL
ALT: 93 U/L — ABNORMAL HIGH (ref 0–44)
AST: 159 U/L — ABNORMAL HIGH (ref 15–41)
Albumin: 3.3 g/dL — ABNORMAL LOW (ref 3.5–5.0)
Alkaline Phosphatase: 60 U/L (ref 38–126)
Anion gap: 8 (ref 5–15)
BUN: 9 mg/dL (ref 6–20)
CO2: 24 mmol/L (ref 22–32)
Calcium: 8.7 mg/dL — ABNORMAL LOW (ref 8.9–10.3)
Chloride: 103 mmol/L (ref 98–111)
Creatinine, Ser: 0.91 mg/dL (ref 0.44–1.00)
GFR, Estimated: >60 mL/min (ref >60)
Glucose, Bld: 107 mg/dL — ABNORMAL HIGH (ref 70–99)
Potassium: 4.1 mmol/L (ref 3.5–5.1)
Sodium: 135 mmol/L (ref 135–145)
Total Bilirubin: 0.7 mg/dL (ref 0.3–1.2)
Total Protein: 6.3 g/dL — ABNORMAL LOW (ref 6.5–8.1)

## 2022-11-26 LAB — CK
Total CK: 11194 U/L — ABNORMAL HIGH (ref 38–234)
Total CK: 7023 U/L — ABNORMAL HIGH (ref 38–234)

## 2022-11-26 NOTE — Progress Notes (Signed)
PROGRESS NOTE  Rebecca Edwards XBJ:[id number] DOB: 1985-07-28   PCP: Mila Palmer, MD  Patient is from: Home.  DOA: 11/22/2022 LOS: 4  Chief complaints Chief Complaint  Patient presents with   Muscle Pain/Cramps     Brief Narrative / Interim history:  37 year old F with PMH of anxiety, depression and thyroid nodule s/p lobectomy in 03/2022 presenting with bilateral leg cramping and aching, and admitted for exertional rhabdomyolysis with CK elevated to 30,000. CK peaked at 36,000 and started trending down to 11,000.  She can likely discharge home on 8/21 CK improves off IV fluid.  To trend down off IV fluid.  Subjective: Seen and examined earlier this morning.  No major events overnight of this morning.  Felt weak and sore in her thighs after she walked in the hallway yesterday.  Objective: Vitals:   11/25/22 1956 11/26/22 0454 11/26/22 0734 11/26/22 1535  BP: (!) 141/83 126/86 121/83 133/75  Pulse: 98 96 91 74  Resp: 18 18 18 18   Temp: 98.1 F (36.7 C) 98.2 F (36.8 C) 98.4 F (36.9 C) 98.3 F (36.8 C)  TempSrc: Oral Oral Oral Oral  SpO2: 98% 98% 98% 98%  Weight:      Height:        Examination:  GENERAL: No apparent distress.  Nontoxic. HEENT: MMM.  Vision and hearing grossly intact.  NECK: Supple.  No apparent JVD.  RESP:  No IWOB.  Fair aeration bilaterally. CVS:  RRR. Heart sounds normal.  ABD/GI/GU: BS+. Abd soft, NTND.  MSK/EXT:  Moves extremities. No apparent deformity.  No edema, SKIN: no apparent skin lesion or wound NEURO: Awake, alert and oriented appropriately.  No apparent focal neuro deficit. PSYCH: Calm. Normal affect.   Procedures:  None  Microbiology summarized: None  Assessment and plan: Principal Problem:   Exertional rhabdomyolysis Active Problems:   Elevated liver enzymes   Anxiety and depression   History of thyroid nodule  Exertional rhabdomyolysis: Likely from strenuous workout involving multiple lower body exercise on day  1.  CK 30k.  Aldolase elevated as well.  Does not seem to be medication that could contribute. No signs of compartment syndrome.  CK peaked at 36k>>> 11k.  LFT improving. -Monitor off IV fluid.  -Robaxin and Tylenol for pain -Counseled on graduated exercise regimen in the future. -Likely discharge on 8/21 if CK continues to improve off IV fluid   Elevated liver enzymes: Pattern consistent with rhabdo.  Does not drink alcohol.  Improving. -Monitor   Anxiety and depression: Stable.  Taken off Abilify and Effexor by PCP recently. -Continue home Klonopin at night   Hyperglycemia: Resolved.  A1c 5.6%.  Hypokalemia -Monitor replenish as appropriate.   History of thyroid nodule s/p lobectomy in 03/2022.  TSH normal.  Body mass index is 28.72 kg/m.           DVT prophylaxis:  Place and maintain sequential compression device Start: 11/24/22 0804  Code Status: Full code Family Communication: None at bedside today. Level of care: Med-Surg Status is: Inpatient Remains inpatient appropriate because: Exertional rhabdomyolysis   Final disposition: Home on 8/21 Consultants:  None  35 minutes with more than 50% spent in reviewing records, counseling patient/family and coordinating care.   Sch Meds:  Scheduled Meds:  clonazepam  0.25 mg Oral QHS   Continuous Infusions:   PRN Meds:.acetaminophen **OR** acetaminophen, ibuprofen, loratadine, methocarbamol, polyethylene glycol, senna-docusate  Antimicrobials: Anti-infectives (From admission, onward)    None        I  have personally reviewed the following labs and images: CBC: Recent Labs  Lab 11/22/22 0235 11/22/22 1538 11/23/22 0655 11/25/22 0431  WBC 12.5* 11.0* 7.7 8.8  NEUTROABS 8.6*  --   --   --   HGB 12.2 10.8* 10.1* 10.4*  HCT 37.7 33.7* 31.4* 32.1*  MCV 85.9 84.3 83.5 85.8  PLT 353 321 280 288   BMP &GFR Recent Labs  Lab 11/22/22 0730 11/22/22 1538 11/23/22 0655 11/24/22 0948 11/25/22 0431  11/26/22 0434  NA  --  138 137 138 134* 135  K  --  3.7 3.7 3.7 3.3* 4.1  CL  --  109 105 106 104 103  CO2  --  21* 24 24 21* 24  GLUCOSE  --  94 109* 106* 95 107*  BUN  --  8 5* <5* 6 9  CREATININE  --  0.84 0.77 0.94 0.79 0.91  CALCIUM  --  8.2* 8.2* 8.8* 8.4* 8.7*  MG 2.2 2.2 2.0  --   --   --   PHOS 3.7  --   --   --   --   --    Estimated Creatinine Clearance: 73.6 mL/min (by C-G formula based on SCr of 0.91 mg/dL). Liver & Pancreas: Recent Labs  Lab 11/22/22 1538 11/23/22 0655 11/24/22 0948 11/25/22 0431 11/26/22 0434  AST 286* 261* 237* 200* 159*  ALT 73* 77* 85* 85* 93*  ALKPHOS 69 61 64 57 60  BILITOT 0.7 0.4 0.7 0.6 0.7  PROT 5.9* 5.9* 6.6 6.2* 6.3*  ALBUMIN 3.1* 3.1* 3.4* 3.2* 3.3*   No results for input(s): "LIPASE", "AMYLASE" in the last 168 hours. No results for input(s): "AMMONIA" in the last 168 hours. Diabetic: No results for input(s): "HGBA1C" in the last 72 hours.  No results for input(s): "GLUCAP" in the last 168 hours. Cardiac Enzymes: Recent Labs  Lab 11/23/22 0655 11/23/22 1019 11/24/22 0948 11/25/22 0431 11/26/22 0434  CKTOTAL 34,211* 36,736* 27,360* 15,180* 11,194*   No results for input(s): "PROBNP" in the last 8760 hours. Coagulation Profile: No results for input(s): "INR", "PROTIME" in the last 168 hours. Thyroid Function Tests: No results for input(s): "TSH", "T4TOTAL", "FREET4", "T3FREE", "THYROIDAB" in the last 72 hours.  Lipid Profile: No results for input(s): "CHOL", "HDL", "LDLCALC", "TRIG", "CHOLHDL", "LDLDIRECT" in the last 72 hours. Anemia Panel: No results for input(s): "VITAMINB12", "FOLATE", "FERRITIN", "TIBC", "IRON", "RETICCTPCT" in the last 72 hours. Urine analysis:    Component Value Date/Time   COLORURINE YELLOW 10/01/2016 2251   APPEARANCEUR HAZY (A) 10/01/2016 2251   LABSPEC 1.021 10/01/2016 2251   PHURINE 5.0 10/01/2016 2251   GLUCOSEU NEGATIVE 10/01/2016 2251   HGBUR SMALL (A) 10/01/2016 2251    BILIRUBINUR NEGATIVE 10/01/2016 2251   KETONESUR NEGATIVE 10/01/2016 2251   PROTEINUR NEGATIVE 10/01/2016 2251   UROBILINOGEN 0.2 09/09/2009 1141   NITRITE NEGATIVE 10/01/2016 2251   LEUKOCYTESUR NEGATIVE 10/01/2016 2251   Sepsis Labs: Invalid input(s): "PROCALCITONIN", "LACTICIDVEN"  Microbiology: No results found for this or any previous visit (from the past 240 hour(s)).  Radiology Studies: No results found.    Ayianna Darnold T. Alijah Akram [facility]  If 7PM-7AM, please contact night-coverage [email or web address] 11/26/2022, 4:44 PM

## 2022-11-26 NOTE — Plan of Care (Signed)

## 2022-11-27 DIAGNOSIS — M6282 Rhabdomyolysis: Secondary | ICD-10-CM | POA: Diagnosis not present

## 2022-11-27 LAB — CBC
HCT: 37.6 % (ref 36.0–46.0)
Hemoglobin: 12.3 g/dL (ref 12.0–15.0)
MCH: 27.7 pg (ref 26.0–34.0)
MCHC: 32.7 g/dL (ref 30.0–36.0)
MCV: 84.7 fL (ref 80.0–100.0)
Platelets: 337 10*3/uL (ref 150–400)
RBC: 4.44 MIL/uL (ref 3.87–5.11)
RDW: 14.7 % (ref 11.5–15.5)
WBC: 10.2 10*3/uL (ref 4.0–10.5)
nRBC: 0 % (ref 0.0–0.2)

## 2022-11-27 LAB — COMPREHENSIVE METABOLIC PANEL
ALT: 88 U/L — ABNORMAL HIGH (ref 0–44)
AST: 92 U/L — ABNORMAL HIGH (ref 15–41)
Albumin: 3.6 g/dL (ref 3.5–5.0)
Alkaline Phosphatase: 67 U/L (ref 38–126)
Anion gap: 9 (ref 5–15)
BUN: 10 mg/dL (ref 6–20)
CO2: 22 mmol/L (ref 22–32)
Calcium: 8.7 mg/dL — ABNORMAL LOW (ref 8.9–10.3)
Chloride: 105 mmol/L (ref 98–111)
Creatinine, Ser: 0.89 mg/dL (ref 0.44–1.00)
GFR, Estimated: >60 mL/min (ref >60)
Glucose, Bld: 104 mg/dL — ABNORMAL HIGH (ref 70–99)
Potassium: 3.7 mmol/L (ref 3.5–5.1)
Sodium: 136 mmol/L (ref 135–145)
Total Bilirubin: 0.5 mg/dL (ref 0.3–1.2)
Total Protein: 6.9 g/dL (ref 6.5–8.1)

## 2022-11-27 LAB — HEPATIC FUNCTION PANEL
ALT: 86 U/L — ABNORMAL HIGH (ref 0–44)
AST: 92 U/L — ABNORMAL HIGH (ref 15–41)
Albumin: 3.6 g/dL (ref 3.5–5.0)
Alkaline Phosphatase: 67 U/L (ref 38–126)
Bilirubin, Direct: <0.1 mg/dL (ref 0.0–0.2)
Total Bilirubin: 0.6 mg/dL (ref 0.3–1.2)
Total Protein: 7.2 g/dL (ref 6.5–8.1)

## 2022-11-27 LAB — CK
Total CK: 4213 U/L — ABNORMAL HIGH (ref 38–234)
Total CK: 2840 U/L — ABNORMAL HIGH (ref 38–234)

## 2022-11-27 LAB — MAGNESIUM: Magnesium: 2.1 mg/dL (ref 1.7–2.4)

## 2022-11-27 LAB — VITAMIN D 25 HYDROXY (VIT D DEFICIENCY, FRACTURES): Vit D, 25-Hydroxy: 46.82 ng/mL (ref 30–100)

## 2022-11-27 MED ORDER — SODIUM CHLORIDE 0.9 % IV SOLN: INTRAVENOUS | Status: DC

## 2022-11-27 NOTE — Progress Notes (Signed)
[facility] Progress Note  Patient: Rebecca Edwards    VHQ:[id number]  DOA: 11/22/2022     Date of Service: the patient was seen and examined on 11/27/2022  Chief Complaint  Patient presents with   Muscle Pain/Cramps   Brief hospital course: 37 year old F with PMH of anxiety, depression and thyroid nodule s/p lobectomy in 03/2022 presenting with bilateral leg cramping and aching, and admitted for exertional rhabdomyolysis with CK elevated to 30,000. CK peaked at 36,000 and started trending down to 11,000--4213 Resumed IV fluid, monitor CK and DC plan tomorrow a.m. if CK trending down.  Assessment and Plan: Exertional rhabdomyolysis: Likely from strenuous workout involving multiple lower body exercise on day 1.  CK 30k.  Aldolase elevated as well.  Does not seem to be medication that could contribute. No signs of compartment syndrome.  CK peaked at 36k>>> 11k.- --4213   LFT improving. -Monitor off IV fluid.  -Robaxin and Tylenol for pain -Counseled on graduated exercise regimen in the future. -Likely discharge on 8/22 if CK continues to improve on IV fluid   Elevated liver enzymes: Pattern consistent with rhabdo.  Does not drink alcohol.  Improving. -Monitor   Anxiety and depression: Stable.  Taken off Abilify and Effexor by PCP recently. -Continue home Klonopin at night   Hyperglycemia: Resolved.  A1c 5.6%.   Hypokalemia -Monitor replenish as appropriate.   History of thyroid nodule s/p lobectomy in 03/2022.  TSH normal.   Body mass index is 28.72 kg/m.  Interventions:  Diet: Regular diet DVT Prophylaxis: SCD,  Advance goals of care discussion: Full code  Family Communication: family was present at bedside, at the time of interview.  The pt provided permission to discuss medical plan with the family. Opportunity was given to ask question and all questions were answered satisfactorily.   Disposition:  Pt is from Home, admitted with rhabdomyolysis,, still has  elevated CK and on IV fluid, which precludes a safe discharge. Discharge to home, when stable most likely tomorrow a.m.  Subjective: No significant events overnight, patient is feeling improvement in generalized bodyaches, denied any complaints.  Patient would like to stay another day on IV fluid to make sure CK level is trending down. Patient would like to go home tomorrow.  Physical Exam: General: NAD, lying comfortably Appear in no distress, affect appropriate Eyes: PERRLA ENT: Oral Mucosa Clear, moist  Neck: no JVD,  Cardiovascular: S1 and S2 Present, no Murmur,  Respiratory: good respiratory effort, Bilateral Air entry equal and Decreased, no Crackles, no wheezes Abdomen: Bowel Sound present, Soft and no tenderness,  Skin: no rashes Extremities: no Pedal edema, no calf tenderness Neurologic: without any new focal findings Gait not checked due to patient safety concerns  Vitals:   11/26/22 1535 11/26/22 2026 11/27/22 0546 11/27/22 0730  BP: 133/75 137/86 123/76 115/70  Pulse: 74 84 86 87  Resp: 18 18 18 18   Temp: 98.3 F (36.8 C) 98.5 F (36.9 C) 98.9 F (37.2 C) 99 F (37.2 C)  TempSrc: Oral Oral Oral Oral  SpO2: 98% 100% 96% 97%  Weight:      Height:        Intake/Output Summary (Last 24 hours) at 11/27/2022 1409 Last data filed at 11/26/2022 1424 Gross per 24 hour  Intake 118 ml  Output --  Net 118 ml   Filed Weights   11/22/22 1026  Weight: 68.9 kg    Data Reviewed: I have personally reviewed and interpreted daily labs, tele strips, imagings as discussed  above. I reviewed all nursing notes, pharmacy notes, vitals, pertinent old records I have discussed plan of care as described above with RN and patient/family.  CBC: Recent Labs  Lab 11/22/22 0235 11/22/22 1538 11/23/22 0655 11/25/22 0431 11/27/22 0541  WBC 12.5* 11.0* 7.7 8.8 10.2  NEUTROABS 8.6*  --   --   --   --   HGB 12.2 10.8* 10.1* 10.4* 12.3  HCT 37.7 33.7* 31.4* 32.1* 37.6  MCV 85.9 84.3  83.5 85.8 84.7  PLT 353 321 280 288 337   Basic Metabolic Panel: Recent Labs  Lab 11/22/22 0730 11/22/22 1538 11/23/22 0655 11/24/22 0948 11/25/22 0431 11/26/22 0434 11/27/22 0541  NA  --  138 137 138 134* 135 136  K  --  3.7 3.7 3.7 3.3* 4.1 3.7  CL  --  109 105 106 104 103 105  CO2  --  21* 24 24 21* 24 22  GLUCOSE  --  94 109* 106* 95 107* 104*  BUN  --  8 5* <5* 6 9 10   CREATININE  --  0.84 0.77 0.94 0.79 0.91 0.89  CALCIUM  --  8.2* 8.2* 8.8* 8.4* 8.7* 8.7*  MG 2.2 2.2 2.0  --   --   --  2.1  PHOS 3.7  --   --   --   --   --   --     Studies: No results found.  Scheduled Meds:  clonazepam  0.25 mg Oral QHS   Continuous Infusions:  sodium chloride     PRN Meds: acetaminophen **OR** acetaminophen, ibuprofen, loratadine, methocarbamol, polyethylene glycol, senna-docusate  Time spent: 35 minutes  Author: Gillis Santa. MD [facility] 11/27/2022 2:09 PM  To reach On-call, see care teams to locate the attending and reach out to them via [email or web address]. If 7PM-7AM, please contact night-coverage If you still have difficulty reaching the attending provider, please page the [facility] (Director on Call) for [facility] on amion for assistance.

## 2022-11-27 NOTE — Plan of Care (Signed)

## 2022-11-28 DIAGNOSIS — M6282 Rhabdomyolysis: Secondary | ICD-10-CM | POA: Diagnosis not present

## 2022-11-28 LAB — BASIC METABOLIC PANEL
Anion gap: 11 (ref 5–15)
BUN: 8 mg/dL (ref 6–20)
CO2: 21 mmol/L — ABNORMAL LOW (ref 22–32)
Calcium: 8.2 mg/dL — ABNORMAL LOW (ref 8.9–10.3)
Chloride: 106 mmol/L (ref 98–111)
Creatinine, Ser: 0.92 mg/dL (ref 0.44–1.00)
GFR, Estimated: >60 mL/min (ref >60)
Glucose, Bld: 98 mg/dL (ref 70–99)
Potassium: 3.9 mmol/L (ref 3.5–5.1)
Sodium: 138 mmol/L (ref 135–145)

## 2022-11-28 LAB — CK: Total CK: 1694 U/L — ABNORMAL HIGH (ref 38–234)

## 2022-11-28 NOTE — Plan of Care (Signed)

## 2022-11-28 NOTE — Discharge Summary (Signed)
Physician Discharge Summary  Rebecca Edwards QIO:[id number] DOB: 1985-04-16 DOA: 11/22/2022  PCP: Mila Palmer, MD  Admit date: 11/22/2022 Discharge date: 11/28/2022  Admitted From: Home Disposition: Home  Recommendations for Outpatient Follow-up:  Follow up with PCP in 1 week with repeat CBC/BMP Follow up in ED if symptoms worsen or new appear   Home Health: No Equipment/Devices: None  Discharge Condition: Stable CODE STATUS: Full Diet recommendation: Regular  Brief/Interim Summary: 37 year old F with PMH of anxiety, depression and thyroid nodule s/p lobectomy in 03/2022 presented with bilateral leg cramping and aching and was admitted for exertional rhabdomyolysis with CK elevated to 30,000.  During the hospitalization, she was treated with IV fluids and CK peaked at around 36,000 and subsequently started trending down.  Her CK 1694 this morning.  She is still having some myalgia but feels much better and feels okay to go home.  She will be discharged home today with outpatient follow-up with PCP.  Encourage oral hydration.  Discharge Diagnoses:   Exertional rhabdomyolysis -On presentation, CK elevated to 30,000.  During the hospitalization, she was treated with IV fluids and CK peaked at around 36,000 and subsequently started trending down.  Her CK 1694 this morning.  She is still having some myalgia but feels much better and feels okay to go home.  She will be discharged home today with outpatient follow-up with PCP.  Encourage oral hydration.  Elevated liver enzymes -Possibly from rhabdomyolysis.  Improving.  Outpatient follow-up.    Leukocytosis -Resolved  Anxiety and depression -Outpatient follow-up.  Continue Klonopin.  Hyponatremia -Resolved  Hypokalemia -Resolved  History of thyroid nodule status post lobectomy in 03/2022 -Outpatient follow-up  Discharge Instructions  Discharge Instructions     Diet general   Complete by: As directed    Increase  activity slowly   Complete by: As directed       Allergies as of 11/28/2022       Reactions   Amoxicillin    Unknown reaction  Has patient had a PCN reaction causing immediate rash, facial/tongue/throat swelling, SOB or lightheadedness with hypotension: Unknown Has patient had a PCN reaction causing severe rash involving mucus membranes or skin necrosis: Unknown Has patient had a PCN reaction that required hospitalization: Unknown Has patient had a PCN reaction occurring within the last 10 years: No If all of the above answers are "NO", then may proceed with Cephalosporin use. Other Reaction(s): facial swelling/itching   Other Other (See Comments)   Spinal anesthesia (spinal block)--itching all over body   Proton Pump Inhibitors    Other Reaction(s): headaches   Sumatriptan    Other Reaction(s): Made migraines worse   Hydrocodone-acetaminophen Itching   Hydrocodone-acetaminophen Itching   Propoxyphene Itching   Propoxyphene N-acetaminophen Itching   Pseudoephedrine Palpitations   Other Reaction(s): heart racing        Medication List     STOP taking these medications    ARIPiprazole 2 MG tablet Commonly known as: ABILIFY   venlafaxine XR 37.5 MG 24 hr capsule Commonly known as: EFFEXOR-XR       TAKE these medications    acetaminophen 500 MG tablet Commonly known as: TYLENOL Take 500 mg by mouth every 6 (six) hours as needed (headaches/pain.).   ascorbic acid 500 MG tablet Commonly known as: VITAMIN C Take 500 mg by mouth every evening.   clonazePAM 0.5 MG tablet Commonly known as: KLONOPIN Take 0.25 mg by mouth every evening.   ibuprofen 200 MG tablet Commonly known as: ADVIL Take  600 mg by mouth every 8 (eight) hours as needed (for pain/headaches.).   levocetirizine 5 MG tablet Commonly known as: XYZAL Take 5 mg by mouth daily as needed for allergies.   methocarbamol 500 MG tablet Commonly known as: ROBAXIN Take 1 tablet (500 mg total) by mouth 2  (two) times daily.   multivitamin with minerals Tabs tablet Take 1 tablet by mouth daily.   PROBIOTIC PO Take 1 capsule by mouth in the morning.   Vitamin D 50 MCG (2000 UT) tablet Take 2,000 Units by mouth in the morning.        Follow-up Information     Mila Palmer, MD. Schedule an appointment as soon as possible for a visit in 1 week(s).   Specialty: Family Medicine Contact information: [address] [place] [address] [phone]                Allergies  Allergen Reactions   Amoxicillin     Unknown reaction   Has patient had a PCN reaction causing immediate rash, facial/tongue/throat swelling, SOB or lightheadedness with hypotension: Unknown  Has patient had a PCN reaction causing severe rash involving mucus membranes or skin necrosis: Unknown  Has patient had a PCN reaction that required hospitalization: Unknown  Has patient had a PCN reaction occurring within the last 10 years: No  If all of the above answers are "NO", then may proceed with Cephalosporin use.  Other Reaction(s): facial swelling/itching   Other Other (See Comments)    Spinal anesthesia (spinal block)--itching all over body   Proton Pump Inhibitors     Other Reaction(s): headaches   Sumatriptan     Other Reaction(s): Made migraines worse   Hydrocodone-Acetaminophen Itching   Hydrocodone-Acetaminophen Itching   Propoxyphene Itching   Propoxyphene N-Acetaminophen Itching   Pseudoephedrine Palpitations    Other Reaction(s): heart racing    Consultations: None   Procedures/Studies: No results found.    Subjective: Patient seen and examined at bedside.  Still having intermittent muscle cramps and pain but feels okay to go home today.  No fever, vomiting or abdominal pain reported.  Discharge Exam: Vitals:   11/28/22 0427 11/28/22 0731  BP: 113/61 120/72  Pulse: 83 99  Resp:  18  Temp: 98.1 F (36.7 C) 98.2 F (36.8 C)  SpO2: 100% 99%    General:  Pt is alert, awake, not in acute distress Cardiovascular: rate controlled, S1/S2 + Respiratory: bilateral decreased breath sounds at bases Abdominal: Soft, NT, ND, bowel sounds + Extremities: no edema, no cyanosis    The results of significant diagnostics from this hospitalization (including imaging, microbiology, ancillary and laboratory) are listed below for reference.     Microbiology: No results found for this or any previous visit (from the past 240 hour(s)).   Labs: BNP (last 3 results) No results for input(s): "BNP" in the last 8760 hours. Basic Metabolic Panel: Recent Labs  Lab 11/22/22 0730 11/22/22 1538 11/23/22 0655 11/24/22 0948 11/25/22 0431 11/26/22 0434 11/27/22 0541 11/28/22 0716  NA  --  138 137 138 134* 135 136 138  K  --  3.7 3.7 3.7 3.3* 4.1 3.7 3.9  CL  --  109 105 106 104 103 105 106  CO2  --  21* 24 24 21* 24 22 21*  GLUCOSE  --  94 109* 106* 95 107* 104* 98  BUN  --  8 5* <5* 6 9 10 8   CREATININE  --  0.84 0.77 0.94 0.79 0.91 0.89  0.92  CALCIUM  --  8.2* 8.2* 8.8* 8.4* 8.7* 8.7* 8.2*  MG 2.2 2.2 2.0  --   --   --  2.1  --   PHOS 3.7  --   --   --   --   --   --   --    Liver Function Tests: Recent Labs  Lab 11/24/22 0948 11/25/22 0431 11/26/22 0434 11/27/22 0540 11/27/22 0541  AST 237* 200* 159* 92* 92*  ALT 85* 85* 93* 86* 88*  ALKPHOS 64 57 60 67 67  BILITOT 0.7 0.6 0.7 0.6 0.5  PROT 6.6 6.2* 6.3* 7.2 6.9  ALBUMIN 3.4* 3.2* 3.3* 3.6 3.6   No results for input(s): "LIPASE", "AMYLASE" in the last 168 hours. No results for input(s): "AMMONIA" in the last 168 hours. CBC: Recent Labs  Lab 11/22/22 0235 11/22/22 1538 11/23/22 0655 11/25/22 0431 11/27/22 0541  WBC 12.5* 11.0* 7.7 8.8 10.2  NEUTROABS 8.6*  --   --   --   --   HGB 12.2 10.8* 10.1* 10.4* 12.3  HCT 37.7 33.7* 31.4* 32.1* 37.6  MCV 85.9 84.3 83.5 85.8 84.7  PLT 353 321 280 288 337   Cardiac Enzymes: Recent Labs  Lab 11/26/22 0434 11/26/22 1750 11/27/22 0541  11/27/22 1837 11/28/22 0716  CKTOTAL 11,194* 7,023* 4,213* 2,840* 1,694*   BNP: Invalid input(s): "POCBNP" CBG: No results for input(s): "GLUCAP" in the last 168 hours. D-Dimer No results for input(s): "DDIMER" in the last 72 hours. Hgb A1c No results for input(s): "HGBA1C" in the last 72 hours. Lipid Profile No results for input(s): "CHOL", "HDL", "LDLCALC", "TRIG", "CHOLHDL", "LDLDIRECT" in the last 72 hours. Thyroid function studies No results for input(s): "TSH", "T4TOTAL", "T3FREE", "THYROIDAB" in the last 72 hours.  Invalid input(s): "FREET3" Anemia work up No results for input(s): "VITAMINB12", "FOLATE", "FERRITIN", "TIBC", "IRON", "RETICCTPCT" in the last 72 hours. Urinalysis    Component Value Date/Time   COLORURINE YELLOW 10/01/2016 2251   APPEARANCEUR HAZY (A) 10/01/2016 2251   LABSPEC 1.021 10/01/2016 2251   PHURINE 5.0 10/01/2016 2251   GLUCOSEU NEGATIVE 10/01/2016 2251   HGBUR SMALL (A) 10/01/2016 2251   BILIRUBINUR NEGATIVE 10/01/2016 2251   KETONESUR NEGATIVE 10/01/2016 2251   PROTEINUR NEGATIVE 10/01/2016 2251   UROBILINOGEN 0.2 09/09/2009 1141   NITRITE NEGATIVE 10/01/2016 2251   LEUKOCYTESUR NEGATIVE 10/01/2016 2251   Sepsis Labs Recent Labs  Lab 11/22/22 1538 11/23/22 0655 11/25/22 0431 11/27/22 0541  WBC 11.0* 7.7 8.8 10.2   Microbiology No results found for this or any previous visit (from the past 240 hour(s)).   Time coordinating discharge: 35 minutes  SIGNED:   Glade Lloyd, MD  [facility] 11/28/2022, 9:44 AM

## 2022-11-28 NOTE — Plan of Care (Signed)

## 2022-11-28 NOTE — Progress Notes (Signed)
Discharge instructions reviewed with pt.  Copy of instructions given to pt. Pt already aware of script for robaxin sent to her pharmacy for pick up. As per MD pt encouraged to stay hydrated with oral fluids. Pt verbalized understanding. Pt states MD was going to provide work note for return to work, reached out to MD for this note, and will print and give to pt. Pt's mother on her way to the hospital.   Pt will be d/c'd via wheelchair with belongings, with her mother and daughter.             To be escorted by hospital volunteer.  Annice Needy, RN SWOT

## 2023-02-17 ENCOUNTER — Other Ambulatory Visit (HOSPITAL_COMMUNITY)
Admission: RE | Admit: 2023-02-17 | Discharge: 2023-02-17 | Disposition: A | Discharge status: Home / Self Care | Payer: BC Managed Care – PPO | Source: Ambulatory Visit | Attending: Nurse Practitioner | Admitting: Nurse Practitioner

## 2023-02-17 ENCOUNTER — Other Ambulatory Visit: Payer: Self-pay | Admitting: Nurse Practitioner

## 2023-02-17 DIAGNOSIS — Z124 Encounter for screening for malignant neoplasm of cervix: Secondary | ICD-10-CM | POA: Diagnosis present

## 2023-02-21 LAB — CYTOLOGY - PAP
Comment: NEGATIVE
Comment: NEGATIVE
Comment: NEGATIVE
Diagnosis: NEGATIVE
HPV 16: NEGATIVE
HPV 18 / 45: NEGATIVE
High risk HPV: POSITIVE — AB

## 2024-04-29 ENCOUNTER — Other Ambulatory Visit: Payer: Self-pay | Admitting: Nurse Practitioner

## 2024-04-29 DIAGNOSIS — N920 Excessive and frequent menstruation with regular cycle: Secondary | ICD-10-CM

## 2024-05-06 ENCOUNTER — Other Ambulatory Visit

## 2024-05-14 ENCOUNTER — Other Ambulatory Visit

## 2024-05-19 ENCOUNTER — Other Ambulatory Visit

## 2024-05-28 ENCOUNTER — Other Ambulatory Visit
